# Patient Record
Sex: Male | Born: 1945
Health system: Southern US, Community
[De-identification: ages and names within clinical notes are randomized; demographics above are authoritative.]

## PROBLEM LIST (undated history)

## (undated) DIAGNOSIS — R9389 Abnormal findings on diagnostic imaging of other specified body structures: Secondary | ICD-10-CM

## (undated) DIAGNOSIS — M1990 Unspecified osteoarthritis, unspecified site: Secondary | ICD-10-CM

## (undated) DIAGNOSIS — E785 Hyperlipidemia, unspecified: Secondary | ICD-10-CM

## (undated) DIAGNOSIS — S21119A Laceration without foreign body of unspecified front wall of thorax without penetration into thoracic cavity, initial encounter: Secondary | ICD-10-CM

## (undated) DIAGNOSIS — I2510 Atherosclerotic heart disease of native coronary artery without angina pectoris: Secondary | ICD-10-CM

## (undated) DIAGNOSIS — I4891 Unspecified atrial fibrillation: Secondary | ICD-10-CM

## (undated) DIAGNOSIS — I493 Ventricular premature depolarization: Secondary | ICD-10-CM

## (undated) DIAGNOSIS — E291 Testicular hypofunction: Secondary | ICD-10-CM

## (undated) DIAGNOSIS — I10 Essential (primary) hypertension: Secondary | ICD-10-CM

## (undated) HISTORY — DX: Ventricular premature depolarization: I49.3

## (undated) HISTORY — DX: Abnormal findings on diagnostic imaging of other specified body structures: R93.89

## (undated) HISTORY — DX: Laceration without foreign body of unspecified front wall of thorax without penetration into thoracic cavity, initial encounter: S21.119A

## (undated) HISTORY — DX: Hyperlipidemia, unspecified: E78.5

## (undated) HISTORY — DX: Testicular hypofunction: E29.1

## (undated) HISTORY — PX: COLONOSCOPY: SHX174

## (undated) HISTORY — DX: Essential (primary) hypertension: I10

## (undated) HISTORY — DX: Unspecified atrial fibrillation: I48.91

## (undated) HISTORY — DX: Atherosclerotic heart disease of native coronary artery without angina pectoris: I25.10

## (undated) HISTORY — DX: Unspecified osteoarthritis, unspecified site: M19.90

## (undated) HISTORY — PX: FRACTURE SURGERY: SHX138

## (undated) HISTORY — PX: INGUINAL HERNIA REPAIR: SUR1180

---

## 2000-10-26 ENCOUNTER — Encounter: Payer: Self-pay | Admitting: Family Medicine

## 2000-10-26 ENCOUNTER — Encounter: Admission: RE | Admit: 2000-10-26 | Discharge: 2000-10-26 | Payer: Self-pay | Admitting: Family Medicine

## 2004-09-15 ENCOUNTER — Ambulatory Visit: Payer: Self-pay | Admitting: Gastroenterology

## 2004-09-26 ENCOUNTER — Ambulatory Visit: Payer: Self-pay | Admitting: Gastroenterology

## 2004-11-24 ENCOUNTER — Ambulatory Visit: Payer: Self-pay | Admitting: Cardiology

## 2004-12-01 ENCOUNTER — Ambulatory Visit: Payer: Self-pay

## 2005-04-14 ENCOUNTER — Encounter: Admission: RE | Admit: 2005-04-14 | Discharge: 2005-04-14 | Payer: Self-pay | Admitting: Sports Medicine

## 2005-04-27 ENCOUNTER — Encounter: Admission: RE | Admit: 2005-04-27 | Discharge: 2005-04-27 | Payer: Self-pay | Admitting: Sports Medicine

## 2006-05-17 ENCOUNTER — Encounter: Admission: RE | Admit: 2006-05-17 | Discharge: 2006-05-17 | Payer: Self-pay | Admitting: Family Medicine

## 2009-09-13 ENCOUNTER — Telehealth (INDEPENDENT_AMBULATORY_CARE_PROVIDER_SITE_OTHER): Payer: Self-pay | Admitting: *Deleted

## 2010-06-28 NOTE — Progress Notes (Signed)
  Faxed Colon over to [facility] [phone] [facility]  September 13, 2009 2:36 PM

## 2012-03-13 ENCOUNTER — Other Ambulatory Visit: Payer: Self-pay | Admitting: Internal Medicine

## 2012-03-13 ENCOUNTER — Ambulatory Visit
Admission: RE | Admit: 2012-03-13 | Discharge: 2012-03-13 | Disposition: A | Discharge status: Home / Self Care | Payer: BC Managed Care – PPO | Source: Ambulatory Visit | Attending: Internal Medicine | Admitting: Internal Medicine

## 2012-03-13 DIAGNOSIS — R51 Headache: Secondary | ICD-10-CM

## 2012-03-13 DIAGNOSIS — H538 Other visual disturbances: Secondary | ICD-10-CM

## 2012-03-13 DIAGNOSIS — Z139 Encounter for screening, unspecified: Secondary | ICD-10-CM

## 2013-04-23 ENCOUNTER — Ambulatory Visit (HOSPITAL_COMMUNITY)
Admission: RE | Admit: 2013-04-23 | Discharge: 2013-04-23 | Disposition: A | Discharge status: Home / Self Care | Payer: BC Managed Care – PPO | Source: Ambulatory Visit | Attending: Internal Medicine | Admitting: Internal Medicine

## 2013-04-23 ENCOUNTER — Other Ambulatory Visit (HOSPITAL_COMMUNITY): Payer: Self-pay | Admitting: Internal Medicine

## 2013-04-23 DIAGNOSIS — I6350 Cerebral infarction due to unspecified occlusion or stenosis of unspecified cerebral artery: Secondary | ICD-10-CM | POA: Insufficient documentation

## 2013-04-23 DIAGNOSIS — I639 Cerebral infarction, unspecified: Secondary | ICD-10-CM

## 2013-04-23 DIAGNOSIS — J329 Chronic sinusitis, unspecified: Secondary | ICD-10-CM | POA: Insufficient documentation

## 2013-04-23 DIAGNOSIS — G930 Cerebral cysts: Secondary | ICD-10-CM | POA: Insufficient documentation

## 2013-04-23 LAB — CREATININE, SERUM: GFR calc Af Amer: 82 mL/min — ABNORMAL LOW (ref >90)

## 2013-04-23 MED ORDER — GADOBENATE DIMEGLUMINE 529 MG/ML IV SOLN
18.0000 mL | Freq: Once | INTRAVENOUS | Status: AC | PRN
  Administered 2013-04-23: 18 mL via INTRAVENOUS

## 2014-11-24 ENCOUNTER — Telehealth: Payer: Self-pay | Admitting: Cardiology

## 2014-11-24 NOTE — Telephone Encounter (Signed)
°  Received records from [facility] for appointment on 02/02/15 with Dr Percival Spanish.  Records given to [facility] (medical records) for Dr Hochrein's schedule on 02/02/15. lp

## 2015-02-02 ENCOUNTER — Ambulatory Visit (INDEPENDENT_AMBULATORY_CARE_PROVIDER_SITE_OTHER): Payer: BLUE CROSS/BLUE SHIELD | Admitting: Cardiology

## 2015-02-02 ENCOUNTER — Encounter: Payer: Self-pay | Admitting: Cardiology

## 2015-02-02 VITALS — BP 178/78 | HR 79 | Ht 68.0 in | Wt 201.3 lb

## 2015-02-02 DIAGNOSIS — I10 Essential (primary) hypertension: Secondary | ICD-10-CM

## 2015-02-02 DIAGNOSIS — E785 Hyperlipidemia, unspecified: Secondary | ICD-10-CM | POA: Insufficient documentation

## 2015-02-02 DIAGNOSIS — R0602 Shortness of breath: Secondary | ICD-10-CM

## 2015-02-02 DIAGNOSIS — R0600 Dyspnea, unspecified: Secondary | ICD-10-CM | POA: Diagnosis not present

## 2015-02-02 HISTORY — DX: Essential (primary) hypertension: I10

## 2015-02-02 HISTORY — DX: Hyperlipidemia, unspecified: E78.5

## 2015-02-02 NOTE — Patient Instructions (Signed)
Your physician recommends that you schedule a follow-up appointment in: As Needed  Your physician has requested that you have an exercise tolerance test. For further information please visit [email or web address]. Please also follow instruction sheet, as given.  Your physician has requested that you have a CT Calcium Score

## 2015-02-02 NOTE — Progress Notes (Signed)
Cardiology Office Note   Date:  02/02/2015   ID:  Matthew Briggs, DOB 04/23/1946, MRN [id number]  PCP:  Matthew Redwood, MD  Cardiologist:   Matthew Breeding, MD   Chief Complaint  Patient presents with  . Hypertension  . Hyperlipidemia      History of Present Illness: Matthew Briggs is a 69 y.o. male who presents for evaluation of dyspnea. He has a history of hypertension hyperlipidemia and family history of early onset coronary disease. He's had treadmills in the past but has never been told that he had any abnormalities. He's had some shortness of breath doing activities such as climbing stairs. 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  He works ridiculously long hours and travels frequently. He goes up and down stairs dyspneic but is not describing any shortness of breath with rest, PND or orthopnea. He has had a history of PVCs but doesn't notice any palpitations, presyncope or syncope. He's had no chest pressure, neck or arm discomfort.    No past medical history on file.  Past Surgical History  Procedure Laterality Date  . Inguinal hernia repair       Current Outpatient Prescriptions  Medication Sig Dispense Refill  . ANDROGEL PUMP 20.25 MG/ACT (1.62%) GEL Apply 1 application topically daily.  0  . aspirin 81 MG tablet Take 81 mg by mouth daily.    . Calcium Carbonate-Vitamin D (CALCIUM 500 + D PO) Take 1 capsule by mouth daily.    . cholecalciferol (VITAMIN D) 1000 UNITS tablet Take 1,000 Units by mouth daily.    . Coenzyme Q10 (CO Q 10) 100 MG CAPS Take 1 capsule by mouth daily.    . CRESTOR 5 MG tablet Take 1 tablet by mouth daily.  1  . Multiple Vitamin (MULTIVITAMIN WITH MINERALS) TABS tablet Take 1 tablet by mouth daily.    . Omega 3 1000 MG CAPS Take 1 capsule by mouth daily.    Marland Kitchen POTASSIUM CHLORIDE PO Take 1 capsule by mouth daily.    . valsartan-hydrochlorothiazide (DIOVAN-HCT) 320-25 MG per tablet Take 1  tablet by mouth daily.  1   No current facility-administered medications for this visit.    Allergies:   Review of patient's allergies indicates no known allergies.    Social History:  The patient  reports that he has never smoked. He does not have any smokeless tobacco history on file. He reports that he drinks about 3.0 oz of alcohol per week. He reports that he does not use illicit drugs.   Family History:  The patient's family history includes CAD in his father; Heart attack (age of onset: 17) in his maternal grandfather; Heart attack (age of onset: 21) in his paternal grandfather.    ROS:  Please see the history of present illness.   Otherwise, review of systems are positive for none.   All other systems are reviewed and negative.    PHYSICAL EXAM: VS:  BP 178/78 mmHg  Pulse 79  Ht 5\' 8"  (1.727 m)  Wt 201 lb 4.8 oz (91.309 kg)  BMI 30.61 kg/m2 , BMI Body mass index is 30.61 kg/(m^2). GENERAL:  Well appearing HEENT:  Pupils equal round and reactive, fundi not visualized, oral mucosa unremarkable NECK:  No jugular venous distention, waveform within normal limits, carotid upstroke brisk and symmetric, no bruits, no thyromegaly LYMPHATICS:  No cervical, inguinal adenopathy LUNGS:  Clear to auscultation bilaterally  BACK:  No CVA tenderness CHEST:  Unremarkable HEART:  PMI not displaced or sustained,S1 and S2 within normal limits, no S3, no S4, no clicks, no rubs, no murmurs ABD:  Flat, positive bowel sounds normal in frequency in pitch, no bruits, no rebound, no guarding, no midline pulsatile mass, no hepatomegaly, no splenomegaly EXT:  2 plus pulses throughout, no edema, no cyanosis no clubbing SKIN:  No rashes no nodules NEURO:  Cranial nerves II through XII grossly intact, motor grossly intact throughout PSYCH:  Cognitively intact, oriented to person place and time    EKG:  EKG is ordered today. The ekg ordered today demonstrates sinus rhythm, rate 79, axis within normal  limits, poor anterior R wave progression, premature ventricular contractions. He   Recent Labs: No results found for requested labs within last 365 days.    Lipid Panel No results found for: CHOL, TRIG, HDL, CHOLHDL, VLDL, LDLCALC, LDLDIRECT    Wt Readings from Last 3 Encounters:  02/02/15 201 lb 4.8 oz (91.309 kg)      Other studies Reviewed: Additional studies/ records that were reviewed today include: Office records Review of the above records demonstrates:  Please see elsewhere in the note.     ASSESSMENT AND PLAN:  DYSPNEA:  He does have risk factors.   I will bring the patient back for a POET (Plain Old Exercise Test). This will allow me to screen for obstructive coronary disease, risk stratify and very importantly provide a prescription for exercise.  He will also have a coronary calcium score.  PVCs:  He has no symptoms related to this.  No change in therapy is indicated.   HTN:  He reports that the BP is usually well controlled.  At this point I will make no changes to the meds.  He will keep a BP diary.    STRESS:  We had a long discussion about this.     Current medicines are reviewed at length with the patient today.  The patient does not have concerns regarding medicines.  The following changes have been made:  no change  Labs/ tests ordered today include:   Orders Placed This Encounter  Procedures  . CT Cardiac Scoring  . Exercise Tolerance Test  . EKG 12-Lead     Disposition:   FU with me as needed.      Signed, Matthew Breeding, MD  02/02/2015 5:32 PM    [facility]

## 2015-02-25 ENCOUNTER — Telehealth (HOSPITAL_COMMUNITY): Payer: Self-pay

## 2015-02-25 NOTE — Telephone Encounter (Signed)
Encounter complete. 

## 2015-02-26 ENCOUNTER — Telehealth (HOSPITAL_COMMUNITY): Payer: Self-pay

## 2015-02-26 NOTE — Telephone Encounter (Signed)
Encounter complete. 

## 2015-03-02 ENCOUNTER — Ambulatory Visit (HOSPITAL_COMMUNITY)
Admission: RE | Admit: 2015-03-02 | Discharge: 2015-03-02 | Disposition: A | Discharge status: Home / Self Care | Payer: BLUE CROSS/BLUE SHIELD | Source: Ambulatory Visit | Attending: Cardiology | Admitting: Cardiology

## 2015-03-02 ENCOUNTER — Telehealth (HOSPITAL_COMMUNITY): Payer: Self-pay

## 2015-03-02 ENCOUNTER — Ambulatory Visit (INDEPENDENT_AMBULATORY_CARE_PROVIDER_SITE_OTHER)
Admission: RE | Admit: 2015-03-02 | Discharge: 2015-03-02 | Disposition: A | Discharge status: Home / Self Care | Payer: BLUE CROSS/BLUE SHIELD | Source: Ambulatory Visit | Attending: Cardiology | Admitting: Cardiology

## 2015-03-02 ENCOUNTER — Encounter (HOSPITAL_COMMUNITY): Payer: Self-pay | Admitting: *Deleted

## 2015-03-02 ENCOUNTER — Inpatient Hospital Stay (HOSPITAL_COMMUNITY): Admission: RE | Admit: 2015-03-02 | Payer: BLUE CROSS/BLUE SHIELD | Source: Ambulatory Visit

## 2015-03-02 DIAGNOSIS — R0602 Shortness of breath: Secondary | ICD-10-CM | POA: Insufficient documentation

## 2015-03-02 LAB — EXERCISE TOLERANCE TEST
CHL CUP RESTING HR STRESS: 64 {beats}/min
CHL RATE OF PERCEIVED EXERTION: 15
CSEPED: 9 min
CSEPEDS: 11 s
Estimated workload: 10.4 METS
MPHR: 153 {beats}/min
Peak HR: 133 {beats}/min
Percent HR: 86 %

## 2015-03-02 NOTE — Telephone Encounter (Signed)
Pt wife states that pt test was scheduled for 9:45 today. I explained to wife that I had previously misspoken the time of his appointment and I did speak with the patient in regards to the same. I also let wife know that the pt was left a message that there were appointment discrepancies and that I did notify scheduling in order to get all of the pt tests scheduled. I did ensure the pt wife that I was doing everything I could to ensure that the pt got all the tests and treatments that have been requested. Pt wife is obviously emotionally upset about the incident. I did assure her that Charlena Cross would be calling back to schedule the stress test and that the test would be scheduled as soon as possible. I did again call Charlena Cross and request that she give them a call ASAP.   Encounter complete.

## 2015-03-02 NOTE — Progress Notes (Signed)
Dr Oval Linsey reviewed ETT. Ok d/c pt home. Delight Hoh A

## 2015-03-02 NOTE — Telephone Encounter (Signed)
Pt wife is to confirm with pt that he has followed instructions. Pt wife notified that pt will have to reschedule if pt has not followed the instructions. Pt to arrive in 25 minutes. Wife is to return call if pt has not followed the instructions.

## 2015-03-08 ENCOUNTER — Encounter: Payer: Self-pay | Admitting: Gastroenterology

## 2015-03-22 ENCOUNTER — Telehealth: Payer: Self-pay | Admitting: Cardiology

## 2015-03-22 DIAGNOSIS — R931 Abnormal findings on diagnostic imaging of heart and coronary circulation: Secondary | ICD-10-CM

## 2015-03-22 DIAGNOSIS — R9439 Abnormal result of other cardiovascular function study: Secondary | ICD-10-CM

## 2015-03-22 NOTE — Telephone Encounter (Signed)
Returned call to patient regarding his POET results. Communicated info that based on elevated coronary calcium score the MD wanted to do a more sensitive stress test - nuclear stress test. Informed him someone will call to schedule. He voiced understanding.   Routed to MD - need to know if wants exercise nuc or lexiscan?

## 2015-03-22 NOTE — Telephone Encounter (Signed)
Pt would like his stress test results asap please.

## 2015-03-23 ENCOUNTER — Encounter: Payer: Self-pay | Admitting: Cardiology

## 2015-03-24 NOTE — Telephone Encounter (Signed)
Returning your call from yesterday. °

## 2015-03-24 NOTE — Telephone Encounter (Signed)
Spoke with MD - lexiscan myoview ordered. Staff message sent to scheduler to arrange.

## 2015-03-26 ENCOUNTER — Telehealth (HOSPITAL_COMMUNITY): Payer: Self-pay

## 2015-03-26 NOTE — Telephone Encounter (Signed)
Encounter complete. 

## 2015-03-30 ENCOUNTER — Ambulatory Visit (HOSPITAL_COMMUNITY)
Admission: RE | Admit: 2015-03-30 | Discharge: 2015-03-30 | Disposition: A | Discharge status: Home / Self Care | Payer: BLUE CROSS/BLUE SHIELD | Source: Ambulatory Visit | Attending: Internal Medicine | Admitting: Internal Medicine

## 2015-03-30 DIAGNOSIS — Z8249 Family history of ischemic heart disease and other diseases of the circulatory system: Secondary | ICD-10-CM | POA: Insufficient documentation

## 2015-03-30 DIAGNOSIS — Z6830 Body mass index (BMI) 30.0-30.9, adult: Secondary | ICD-10-CM | POA: Diagnosis not present

## 2015-03-30 DIAGNOSIS — R42 Dizziness and giddiness: Secondary | ICD-10-CM | POA: Insufficient documentation

## 2015-03-30 DIAGNOSIS — E663 Overweight: Secondary | ICD-10-CM | POA: Insufficient documentation

## 2015-03-30 DIAGNOSIS — R0609 Other forms of dyspnea: Secondary | ICD-10-CM | POA: Insufficient documentation

## 2015-03-30 DIAGNOSIS — R9439 Abnormal result of other cardiovascular function study: Secondary | ICD-10-CM

## 2015-03-30 DIAGNOSIS — I2510 Atherosclerotic heart disease of native coronary artery without angina pectoris: Secondary | ICD-10-CM | POA: Insufficient documentation

## 2015-03-30 DIAGNOSIS — I10 Essential (primary) hypertension: Secondary | ICD-10-CM | POA: Insufficient documentation

## 2015-03-30 DIAGNOSIS — R002 Palpitations: Secondary | ICD-10-CM | POA: Insufficient documentation

## 2015-03-30 DIAGNOSIS — R931 Abnormal findings on diagnostic imaging of heart and coronary circulation: Secondary | ICD-10-CM

## 2015-03-30 LAB — MYOCARDIAL PERFUSION IMAGING
Peak HR: 80 {beats}/min
Rest HR: 54 {beats}/min
SDS: 5
SRS: 5
SSS: 8
TID: 1.21

## 2015-03-30 MED ORDER — REGADENOSON 0.4 MG/5ML IV SOLN
0.4000 mg | Freq: Once | INTRAVENOUS | Status: AC
  Administered 2015-03-30: 0.4 mg via INTRAVENOUS

## 2015-03-30 MED ORDER — TECHNETIUM TC 99M SESTAMIBI GENERIC - CARDIOLITE
30.9000 | Freq: Once | INTRAVENOUS | Status: AC | PRN
  Administered 2015-03-30: 30.9 via INTRAVENOUS

## 2015-03-30 MED ORDER — TECHNETIUM TC 99M SESTAMIBI GENERIC - CARDIOLITE
10.9000 | Freq: Once | INTRAVENOUS | Status: AC | PRN
  Administered 2015-03-30: 10.9 via INTRAVENOUS

## 2015-03-30 MED ORDER — AMINOPHYLLINE 25 MG/ML IV SOLN
75.0000 mg | Freq: Once | INTRAVENOUS | Status: AC
  Administered 2015-03-30: 75 mg via INTRAVENOUS

## 2015-04-29 ENCOUNTER — Ambulatory Visit: Payer: BLUE CROSS/BLUE SHIELD | Admitting: Cardiology

## 2015-05-10 ENCOUNTER — Encounter: Payer: Self-pay | Admitting: Cardiology

## 2015-05-10 ENCOUNTER — Ambulatory Visit (INDEPENDENT_AMBULATORY_CARE_PROVIDER_SITE_OTHER): Payer: BLUE CROSS/BLUE SHIELD | Admitting: Cardiology

## 2015-05-10 VITALS — BP 158/100 | HR 78 | Ht 68.0 in | Wt 200.0 lb

## 2015-05-10 DIAGNOSIS — I2510 Atherosclerotic heart disease of native coronary artery without angina pectoris: Secondary | ICD-10-CM

## 2015-05-10 DIAGNOSIS — R931 Abnormal findings on diagnostic imaging of heart and coronary circulation: Secondary | ICD-10-CM | POA: Insufficient documentation

## 2015-05-10 MED ORDER — AMLODIPINE BESYLATE 2.5 MG PO TABS: 2.5000 mg | ORAL_TABLET | Freq: Every day | ORAL | Status: DC

## 2015-05-10 NOTE — Progress Notes (Signed)
Cardiology Office Note   Date:  05/10/2015   ID:  Matthew Briggs, DOB 07-Jan-1946, MRN [id number]  PCP:  Marton Redwood, MD  Cardiologist:   Minus Breeding, MD   Chief Complaint  Patient presents with  . Coronary Caclcium      History of Present Illness: Matthew Briggs is a 69 y.o. male who presents for evaluation of dyspnea. He has a history of hypertension hyperlipidemia and family history of early onset coronary disease. I sent him for a calcium score that was greater than 800 and 89% for his age.  He had a stress perfusion study that demonstrated  A fixed defect in the apical region.  The study was not gated secondary to PVCs.   There was a small pulmonary nodule noted as well.      He's had treadmills in the past but has never been told that he had any abnormalities. He's had some shortness of breath doing activities such as climbing stairs. 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  He works ridiculously long hours and travels frequently. He goes up and down stairs dyspneic but is not describing any shortness of breath with rest, PND or orthopnea. He has had a history of PVCs but doesn't notice any palpitations, presyncope or syncope. He's had no chest pressure, neck or arm discomfort.    Past Medical History  Diagnosis Date  . HTN (hypertension)   . Hyperlipemia   . Hypogonadism, male   . Abnormal MRI     Past Surgical History  Procedure Laterality Date  . Inguinal hernia repair       Current Outpatient Prescriptions  Medication Sig Dispense Refill  . ANDROGEL PUMP 20.25 MG/ACT (1.62%) GEL Apply 1 application topically daily.  0  . aspirin 81 MG tablet Take 81 mg by mouth daily.    . Calcium Carbonate-Vitamin D (CALCIUM 500 + D PO) Take 1 capsule by mouth daily.    . cholecalciferol (VITAMIN D) 1000 UNITS tablet Take 1,000 Units by mouth daily.    . Coenzyme Q10 (CO Q 10) 100 MG CAPS Take 1 capsule by mouth  daily.    . CRESTOR 5 MG tablet Take 1 tablet by mouth daily.  1  . Multiple Vitamin (MULTIVITAMIN WITH MINERALS) TABS tablet Take 1 tablet by mouth daily.    . Omega 3 1000 MG CAPS Take 1 capsule by mouth daily.    Marland Kitchen POTASSIUM CHLORIDE PO Take 1 capsule by mouth daily.    . valsartan-hydrochlorothiazide (DIOVAN-HCT) 320-25 MG per tablet Take 1 tablet by mouth daily.  1  . amLODipine (NORVASC) 2.5 MG tablet Take 1 tablet (2.5 mg total) by mouth daily. 30 tablet 6   No current facility-administered medications for this visit.    Allergies:   Review of patient's allergies indicates no known allergies.    ROS:  Please see the history of present illness.   Otherwise, review of systems are positive for none.   All other systems are reviewed and negative.    PHYSICAL EXAM: VS:  BP 158/100 mmHg  Pulse 78  Ht 5\' 8"  (1.727 m)  Wt 200 lb (90.719 kg)  BMI 30.42 kg/m2 , BMI Body mass index is 30.42 kg/(m^2). GENERAL:  Well appearing HEENT:  Pupils equal round and reactive, fundi not visualized, oral mucosa unremarkable NECK:  No jugular venous distention, waveform within normal limits, carotid upstroke brisk and symmetric, no  bruits, no thyromegaly LYMPHATICS:  No cervical, inguinal adenopathy LUNGS:  Clear to auscultation bilaterally BACK:  No CVA tenderness CHEST:  Unremarkable HEART:  PMI not displaced or sustained,S1 and S2 within normal limits, no S3, no S4, no clicks, no rubs, no murmurs ABD:  Flat, positive bowel sounds normal in frequency in pitch, no bruits, no rebound, no guarding, no midline pulsatile mass, no hepatomegaly, no splenomegaly EXT:  2 plus pulses throughout, no edema, no cyanosis no clubbing SKIN:  No rashes no nodules NEURO:  Cranial nerves II through XII grossly intact, motor grossly intact throughout PSYCH:  Cognitively intact, oriented to person place and time    EKG:  EKG is ordered today. The ekg ordered today demonstrates sinus rhythm, rate 79, axis within  normal limits, poor anterior R wave progression, premature ventricular contractions. He   Recent Labs: No results found for requested labs within last 365 days.    Lipid Panel No results found for: CHOL, TRIG, HDL, CHOLHDL, VLDL, LDLCALC, LDLDIRECT    Wt Readings from Last 3 Encounters:  05/10/15 200 lb (90.719 kg)  03/30/15 201 lb (91.173 kg)  02/02/15 201 lb 4.8 oz (91.309 kg)      Other studies Reviewed: Additional studies/ records that were reviewed today include: Office records Review of the above records demonstrates:  Please see elsewhere in the note.     ASSESSMENT AND PLAN:  CORONARY CALCIUM:  He needs aggressive risk reduction and we talked about this. I will suggest a POET (Plain Old Exercise Treadmill) yearly.    PULMONARY NODULE:  He is not high risk for lung cancer so no follow-up is suggested.  PVCs:  He has no symptoms related to this.  No change in therapy is indicated.   HTN:  I suspect his blood pressure is fine most of the time because of stress. I'm going to add Norvasc 2.5 mg daily.  DYSLIPIDEMIA:  He reports that his LDL was somewhat high but his HDL was good. I will defer to Marton Redwood, MD with a suggestion that his goal LDL should be in the 26s.   STRESS:  We had a long discussion about this at the last appt.     Current medicines are reviewed at length with the patient today.  The patient does not have concerns regarding medicines.  The following changes have been made:  As above  Labs/ tests ordered today include:    Orders Placed This Encounter  Procedures  . Exercise Tolerance Test     Disposition:   FU with me in one year after POET (Plain Old Exercise Treadmill)   Signed, Minus Breeding, MD  05/10/2015 11:19 AM    [facility]

## 2015-05-10 NOTE — Patient Instructions (Signed)
Your physician wants you to follow-up in: 1 Year. You will receive a reminder letter in the mail two months in advance. If you don't receive a letter, please call our office to schedule the follow-up appointment.  Your physician has requested that you have an exercise tolerance test in 1 Year. For further information please visit [email or web address]. Please also follow instruction sheet, as given.  Your physician has recommended you make the following change in your medication: START Norvasc 2.5 mg daily  Merry Christmas and [place]!!

## 2015-05-11 ENCOUNTER — Encounter: Payer: BLUE CROSS/BLUE SHIELD | Admitting: Gastroenterology

## 2015-05-25 ENCOUNTER — Telehealth: Payer: Self-pay | Admitting: *Deleted

## 2015-05-25 NOTE — Telephone Encounter (Signed)
Since POET is ordered we should wait for this result before colonoscopy.

## 2015-05-25 NOTE — Telephone Encounter (Signed)
Dr Fuller Plan, Pt is scheduled with you for a recall colon for screening 1-19 Thursday.  He has recently seen cardio for an abnormal stress test . His calcium score was > than 800 and 89 % for his age and he was instructed he needs aggressive treatment for this.  He has had past treadmills that were all normal and he had some PVC'S that he is not symptomatic for . Cardio ordered POET and started Norvasc 2.5 mg daily. Pt has high stress due to job.  There are  no results  for this exercise tolerance test .  Do we need to get these results  prior to his colon?  Please advise Thanks, Marijean Niemann

## 2015-05-25 NOTE — Telephone Encounter (Signed)
Dr Fuller Plan, pt states he had an exercise test 03-30-2015 that was normal and the order is for next year for the POET.   Im very sorry, I failed to see the yearly in the cardio note.  So with this being normal, he should be good to go as scheduled??  Thanks and again sorry, Lelan Pons PV

## 2015-05-26 NOTE — Telephone Encounter (Signed)
Yes, ok to schedule colonoscopy now

## 2015-05-27 NOTE — Telephone Encounter (Signed)
Thank you. Will proceed as scheduled

## 2015-06-03 ENCOUNTER — Ambulatory Visit (AMBULATORY_SURGERY_CENTER): Payer: Self-pay | Admitting: *Deleted

## 2015-06-03 VITALS — Ht 69.0 in | Wt 198.6 lb

## 2015-06-03 DIAGNOSIS — Z1211 Encounter for screening for malignant neoplasm of colon: Secondary | ICD-10-CM

## 2015-06-03 MED ORDER — NA SULFATE-K SULFATE-MG SULF 17.5-3.13-1.6 GM/177ML PO SOLN: ORAL | Status: DC

## 2015-06-03 NOTE — Progress Notes (Signed)
No egg or soy allergy  No trouble with intubation  No diet medications taken

## 2015-06-15 ENCOUNTER — Encounter: Payer: Self-pay | Admitting: Gastroenterology

## 2015-06-15 ENCOUNTER — Ambulatory Visit (AMBULATORY_SURGERY_CENTER): Payer: BLUE CROSS/BLUE SHIELD | Admitting: Gastroenterology

## 2015-06-15 VITALS — BP 118/73 | HR 53 | Temp 96.7°F | Resp 20 | Ht 69.0 in | Wt 198.0 lb

## 2015-06-15 DIAGNOSIS — D123 Benign neoplasm of transverse colon: Secondary | ICD-10-CM | POA: Diagnosis not present

## 2015-06-15 DIAGNOSIS — D122 Benign neoplasm of ascending colon: Secondary | ICD-10-CM | POA: Diagnosis not present

## 2015-06-15 DIAGNOSIS — Z1211 Encounter for screening for malignant neoplasm of colon: Secondary | ICD-10-CM | POA: Diagnosis not present

## 2015-06-15 MED ORDER — SODIUM CHLORIDE 0.9 % IV SOLN: 500.0000 mL | INTRAVENOUS | Status: DC

## 2015-06-15 NOTE — Progress Notes (Signed)
Report to PACU, RN, vss, BBS= Clear.  

## 2015-06-15 NOTE — Op Note (Signed)
[place]  [address]. [place], [id number]   COLONOSCOPY PROCEDURE REPORT  PATIENT: Matthew, Briggs  MR#: [id number] BIRTHDATE: 09-15-1945 , 30  yrs. old GENDER: male ENDOSCOPIST: Ladene Artist, MD, [facility] REFERRED BY:W.  Lutricia Feil, M.D. PROCEDURE DATE:  06/15/2015 PROCEDURE:   Colonoscopy, screening and Colonoscopy with snare polypectomy First Screening Colonoscopy - Avg.  risk and is 50 yrs.  old or older - No.  Prior Negative Screening - Now for repeat screening. 10 or more years since last screening  History of Adenoma - Now for follow-up colonoscopy & has been > or = to 3 yrs.  N/A  Polyps removed today? Yes ASA CLASS:   Class II INDICATIONS:Screening for colonic neoplasia and Colorectal Neoplasm Risk Assessment for this procedure is average risk. MEDICATIONS: Monitored anesthesia care and Propofol 200 mg IV DESCRIPTION OF PROCEDURE:   After the risks benefits and alternatives of the procedure were thoroughly explained, informed consent was obtained.  The digital rectal exam revealed no abnormalities of the rectum.   The [id number]  endoscope was introduced through the anus and advanced to the cecum, which was identified by both the appendix and ileocecal valve. No adverse events experienced.   The quality of the prep was excellent. (Suprep was used)  The instrument was then slowly withdrawn as the colon was fully examined. Estimated blood loss is zero unless otherwise noted in this procedure report.    COLON FINDINGS: Two sessile polyps measuring 6 mm in size were found in the transverse colon and ascending colon.  Polypectomies were performed with a cold snare.  The resection was complete, the polyp tissue was completely retrieved and sent to histology.   There was moderate diverticulosis noted in the sigmoid colon and descending colon with associated luminal narrowing and tortuosity.   The examination was otherwise normal.  Retroflexed  views revealed no abnormalities. The time to cecum = 2.5 Withdrawal time = 11.3   The scope was withdrawn and the procedure completed. COMPLICATIONS: There were no immediate complications.  ENDOSCOPIC IMPRESSION: 1.   Two sessile polyps in the transverse colon and ascending colon; polypectomies performed with a cold snare 2.   Moderate diverticulosis in the sigmoid colon and descending colon  RECOMMENDATIONS: 1.  Await pathology results 2.  High fiber diet with liberal fluid intake. 3.  Repeat colonoscopy in 5 years if polyp(s) adenomatous; otherwise 10 years  eSigned:  Ladene Artist, MD, [facility] 06/15/2015 11:41 AM

## 2015-06-15 NOTE — Patient Instructions (Signed)
YOU HAD AN ENDOSCOPIC PROCEDURE TODAY AT THE [facility]:   Refer to the procedure report that was given to you for any specific questions about what was found during the examination.  If the procedure report does not answer your questions, please call your gastroenterologist to clarify.  If you requested that your care partner not be given the details of your procedure findings, then the procedure report has been included in a sealed envelope for you to review at your convenience later.  YOU SHOULD EXPECT: Some feelings of bloating in the abdomen. Passage of more gas than usual.  Walking can help get rid of the air that was put into your GI tract during the procedure and reduce the bloating. If you had a lower endoscopy (such as a colonoscopy or flexible sigmoidoscopy) you may notice spotting of blood in your stool or on the toilet paper. If you underwent a bowel prep for your procedure, you may not have a normal bowel movement for a few days.  Please Note:  You might notice some irritation and congestion in your nose or some drainage.  This is from the oxygen used during your procedure.  There is no need for concern and it should clear up in a day or so.  SYMPTOMS TO REPORT IMMEDIATELY:   Following lower endoscopy (colonoscopy or flexible sigmoidoscopy):  Excessive amounts of blood in the stool  Significant tenderness or worsening of abdominal pains  Swelling of the abdomen that is new, acute  Fever of 100F or higher    For urgent or emergent issues, a gastroenterologist can be reached at any hour by calling [phone].   DIET: Your first meal following the procedure should be a small meal and then it is ok to progress to your normal diet. Heavy or fried foods are harder to digest and may make you feel nauseous or bloated.  Likewise, meals heavy in dairy and vegetables can increase bloating.  Drink plenty of fluids but you should avoid alcoholic beverages for 24  hours.  ACTIVITY:  You should plan to take it easy for the rest of today and you should NOT DRIVE or use heavy machinery until tomorrow (because of the sedation medicines used during the test).    FOLLOW UP: Our staff will call the number listed on your records the next business day following your procedure to check on you and address any questions or concerns that you may have regarding the information given to you following your procedure. If we do not reach you, we will leave a message.  However, if you are feeling well and you are not experiencing any problems, there is no need to return our call.  We will assume that you have returned to your regular daily activities without incident.  If any biopsies were taken you will be contacted by phone or by letter within the next 1-3 weeks.  Please call us at [phone] if you have not heard about the biopsies in 3 weeks.    SIGNATURES/CONFIDENTIALITY: You and/or your care partner have signed paperwork which will be entered into your electronic medical record.  These signatures attest to the fact that that the information above on your After Visit Summary has been reviewed and is understood.  Full responsibility of the confidentiality of this discharge information lies with you and/or your care-partner.   Resume medications. Information given on polyps,diverticulosis and high fiber diet. 

## 2015-06-15 NOTE — Progress Notes (Signed)
Called to room to assist during endoscopic procedure.  Patient ID and intended procedure confirmed with present staff. Received instructions for my participation in the procedure from the performing physician.  

## 2015-06-16 ENCOUNTER — Telehealth: Payer: Self-pay

## 2015-06-16 NOTE — Telephone Encounter (Signed)
  Follow up Call-  Call back number 06/15/2015  Post procedure Call Back phone  # [phone]  Permission to leave phone message Yes     Patient questions:  Do you have a fever, pain , or abdominal swelling? No. Pain Score  0 *  Have you tolerated food without any problems? Yes.    Have you been able to return to your normal activities? Yes.    Do you have any questions about your discharge instructions: Diet   No. Medications  No. Follow up visit  No.  Do you have questions or concerns about your Care? No.  Actions: * If pain score is 4 or above: No action needed, pain <4.

## 2015-06-17 ENCOUNTER — Encounter: Payer: BLUE CROSS/BLUE SHIELD | Admitting: Gastroenterology

## 2015-06-24 ENCOUNTER — Encounter: Payer: Self-pay | Admitting: Gastroenterology

## 2015-12-22 ENCOUNTER — Other Ambulatory Visit: Payer: Self-pay

## 2015-12-22 MED ORDER — AMLODIPINE BESYLATE 2.5 MG PO TABS: 2.5000 mg | ORAL_TABLET | Freq: Every day | ORAL | 6 refills | Status: DC

## 2016-05-17 NOTE — Progress Notes (Signed)
Cardiology Office Note   Date:  05/19/2016   ID:  Matthew Briggs, DOB 1945-08-14, MRN [id number]  PCP:  Matthew Redwood, MD  Cardiologist:   Matthew Breeding, MD   Chief Complaint  Patient presents with  . Coronary Calcium     History of Present Illness: Matthew Briggs is a 70 y.o. male who presents for evaluation follow up of calcium score that was greater than 800 and 89% for his age.  He had a stress perfusion study that demonstrated a fixed defect in the apical region.  The study was not gated secondary to PVCs.   There was a small pulmonary nodule noted as well.  He returns for follow up.  He continues to have a high stress job. 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  With this level of activity he denies any cardiovascular symptoms however. He might have some shortness of breath with significant exertion.  The patient denies any new symptoms such as chest discomfort, neck or arm discomfort. There has been no new shortness of breath, PND or orthopnea. There have been no reported palpitations, presyncope or syncope.  He has not noticed his.   Past Medical History:  Diagnosis Date  . Abnormal MRI   . Arthritis   . HTN (hypertension)   . Hyperlipemia   . Hypogonadism, male     Past Surgical History:  Procedure Laterality Date  . COLONOSCOPY    . INGUINAL HERNIA REPAIR       Current Outpatient Prescriptions  Medication Sig Dispense Refill  . amLODipine (NORVASC) 2.5 MG tablet Take 1 tablet (2.5 mg total) by mouth daily. 30 tablet 6  . aspirin 81 MG tablet Take 81 mg by mouth daily.    . Calcium Carbonate-Vitamin D (CALCIUM 500 + D PO) Take 1 capsule by mouth daily.    . cholecalciferol (VITAMIN D) 1000 UNITS tablet Take 1,000 Units by mouth daily.    . Coenzyme Q10 (CO Q 10) 100 MG CAPS Take 1 capsule by mouth daily.    . CRESTOR 5 MG tablet Take 1 tablet by mouth daily.  1  . Multiple Vitamin (MULTIVITAMIN WITH MINERALS) TABS tablet Take 1 tablet by mouth daily.    . NON  FORMULARY     . Omega 3 1000 MG CAPS Take 1 capsule by mouth daily.    Marland Kitchen POTASSIUM CHLORIDE PO Take 1 capsule by mouth daily.    . valsartan-hydrochlorothiazide (DIOVAN-HCT) 320-25 MG per tablet Take 1 tablet by mouth daily.  1   No current facility-administered medications for this visit.     Allergies:   Patient has no known allergies.    ROS:  Please see the history of present illness.   Otherwise, review of systems are positive for cough.   All other systems are reviewed and negative.    PHYSICAL EXAM: VS:  BP 126/72   Pulse 61   Ht 5\' 8"  (1.727 m)   Wt 204 lb (92.5 kg)   BMI 31.02 kg/m  , BMI Body mass index is 31.02 kg/m. GENERAL:  Well appearing NECK:  No jugular venous distention, waveform within normal limits, carotid upstroke brisk and symmetric, no bruits, no thyromegaly LUNGS:  Clear to auscultation bilaterally BACK:  No CVA tenderness CHEST:  Unremarkable HEART:  PMI not displaced or sustained,S1 and S2 within normal limits, no S3, no S4, no clicks, no rubs, no murmurs ABD:  Flat, positive bowel sounds normal in frequency in pitch, no bruits,  no rebound, no guarding, no midline pulsatile mass, no hepatomegaly, no splenomegaly EXT:  2 plus pulses throughout, no edema, no cyanosis no clubbing SKIN:  No rashes no nodules   EKG:  EKG is  ordered today. The ekg ordered today demonstrates sinus rhythm, rate 61, axis within normal limits, poor anterior R wave progression, premature ventricular contractions. He   Recent Labs: No results found for requested labs within last 8760 hours.    Lipid Panel No results found for: CHOL, TRIG, HDL, CHOLHDL, VLDL, LDLCALC, LDLDIRECT    Wt Readings from Last 3 Encounters:  05/19/16 204 lb (92.5 kg)  06/15/15 198 lb (89.8 kg)  06/03/15 198 lb 9.6 oz (90.1 kg)      Other studies Reviewed: Additional studies/ records that were reviewed today include: Office records Review of the above records demonstrates:  Please see  elsewhere in the note.     ASSESSMENT AND PLAN:  CORONARY CALCIUM:  He needs aggressive risk reduction and we talked about this. I will suggest a POET (Plain Old Exercise Treadmill) yearly.  He will come back to have this done.    PULMONARY NODULE:  He is not high risk for lung cancer so no follow-up is suggested.    PVCs:  He has no symptoms related to this.  No change in therapy is indicated.   HTN:  At the last appt I added Norvasc 2.5 mg.   His BP seems to be at target.  DYSLIPIDEMIA:  He reports that his LDL was somewhat high but his HDL was good. I will defer to Matthew Redwood, MD with a suggestion that his goal LDL should be in the 1s.   STRESS:  He is thinking about retiring.     Current medicines are reviewed at length with the patient today.  The patient does not have concerns regarding medicines.  The following changes have been made:  None  Labs/ tests ordered today include:    Orders Placed This Encounter  Procedures  . EKG 12-Lead     Disposition:   FU with me in one year.    Signed, Matthew Breeding, MD  05/19/2016 9:30 AM    [place]

## 2016-05-19 ENCOUNTER — Encounter: Payer: Self-pay | Admitting: Cardiology

## 2016-05-19 ENCOUNTER — Ambulatory Visit (INDEPENDENT_AMBULATORY_CARE_PROVIDER_SITE_OTHER): Payer: BLUE CROSS/BLUE SHIELD | Admitting: Cardiology

## 2016-05-19 VITALS — BP 126/72 | HR 61 | Ht 68.0 in | Wt 204.0 lb

## 2016-05-19 DIAGNOSIS — R0609 Other forms of dyspnea: Secondary | ICD-10-CM | POA: Diagnosis not present

## 2016-05-19 DIAGNOSIS — R931 Abnormal findings on diagnostic imaging of heart and coronary circulation: Secondary | ICD-10-CM

## 2016-05-19 DIAGNOSIS — E782 Mixed hyperlipidemia: Secondary | ICD-10-CM | POA: Diagnosis not present

## 2016-05-19 NOTE — Patient Instructions (Signed)
Medication Instructions:  Continue current medications  Labwork: None Ordered  Testing/Procedures: Your physician has requested that you have an exercise tolerance test. For further information please visit [email or web address]. Please also follow instruction sheet, as given.  Follow-Up: Your physician wants you to follow-up in: 1 Year. You will receive a reminder letter in the mail two months in advance. If you don't receive a letter, please call our office to schedule the follow-up appointment.   Any Other Special Instructions Will Be Listed Below (If Applicable).           HAPPY HOLIDAY  If you need a refill on your cardiac medications before your next appointment, please call your pharmacy.

## 2016-06-02 ENCOUNTER — Telehealth (HOSPITAL_COMMUNITY): Payer: Self-pay

## 2016-06-02 NOTE — Telephone Encounter (Signed)
Encounter complete. 

## 2016-06-07 ENCOUNTER — Ambulatory Visit (HOSPITAL_COMMUNITY)
Admission: RE | Admit: 2016-06-07 | Discharge: 2016-06-07 | Disposition: A | Discharge status: Home / Self Care | Payer: PPO | Source: Ambulatory Visit | Attending: Cardiovascular Disease | Admitting: Cardiovascular Disease

## 2016-06-07 DIAGNOSIS — R931 Abnormal findings on diagnostic imaging of heart and coronary circulation: Secondary | ICD-10-CM | POA: Diagnosis not present

## 2016-06-07 LAB — EXERCISE TOLERANCE TEST
CHL CUP MPHR: 152 {beats}/min
CSEPED: 8 min
CSEPEDS: 0 s
CSEPHR: 90 %
Estimated workload: 10.1 METS
Peak HR: 137 {beats}/min
RPE: 17
Rest HR: 61 {beats}/min

## 2016-08-01 DIAGNOSIS — M67912 Unspecified disorder of synovium and tendon, left shoulder: Secondary | ICD-10-CM | POA: Diagnosis not present

## 2016-08-01 DIAGNOSIS — M79672 Pain in left foot: Secondary | ICD-10-CM | POA: Diagnosis not present

## 2016-08-05 ENCOUNTER — Other Ambulatory Visit: Payer: Self-pay | Admitting: Cardiology

## 2016-12-15 DIAGNOSIS — D225 Melanocytic nevi of trunk: Secondary | ICD-10-CM | POA: Diagnosis not present

## 2016-12-15 DIAGNOSIS — D1801 Hemangioma of skin and subcutaneous tissue: Secondary | ICD-10-CM | POA: Diagnosis not present

## 2016-12-15 DIAGNOSIS — L570 Actinic keratosis: Secondary | ICD-10-CM | POA: Diagnosis not present

## 2016-12-15 DIAGNOSIS — L821 Other seborrheic keratosis: Secondary | ICD-10-CM | POA: Diagnosis not present

## 2016-12-15 DIAGNOSIS — Z85828 Personal history of other malignant neoplasm of skin: Secondary | ICD-10-CM | POA: Diagnosis not present

## 2017-01-02 DIAGNOSIS — H2513 Age-related nuclear cataract, bilateral: Secondary | ICD-10-CM | POA: Diagnosis not present

## 2017-01-02 DIAGNOSIS — H524 Presbyopia: Secondary | ICD-10-CM | POA: Diagnosis not present

## 2017-01-24 ENCOUNTER — Telehealth: Payer: Self-pay | Admitting: Cardiology

## 2017-01-24 DIAGNOSIS — E784 Other hyperlipidemia: Secondary | ICD-10-CM | POA: Diagnosis not present

## 2017-01-24 DIAGNOSIS — I2510 Atherosclerotic heart disease of native coronary artery without angina pectoris: Secondary | ICD-10-CM | POA: Diagnosis not present

## 2017-01-24 DIAGNOSIS — R002 Palpitations: Secondary | ICD-10-CM | POA: Diagnosis not present

## 2017-01-24 DIAGNOSIS — I10 Essential (primary) hypertension: Secondary | ICD-10-CM | POA: Diagnosis not present

## 2017-01-24 DIAGNOSIS — Z6830 Body mass index (BMI) 30.0-30.9, adult: Secondary | ICD-10-CM | POA: Diagnosis not present

## 2017-01-24 DIAGNOSIS — R0609 Other forms of dyspnea: Secondary | ICD-10-CM | POA: Diagnosis not present

## 2017-01-24 NOTE — Telephone Encounter (Signed)
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

## 2017-01-24 NOTE — Telephone Encounter (Signed)
New message   Pt wife calling.  Patient c/o Palpitations:  High priority if patient c/o lightheadedness and shortness of breath.  1. How long have you been having palpitations? Over 2-3 days  2. Are you currently experiencing lightheadedness and shortness of breath? Not mentioned to wife.  3. Have you checked your BP and heart rate? (document readings) no   4. Are you experiencing any other symptoms? Not that he told his wife.

## 2017-01-25 NOTE — Progress Notes (Signed)
Cardiology Office Note   Date:  01/27/2017   ID:  Matthew Briggs, DOB [birth year], MRN [id number]  PCP:  Marton Redwood, MD  Cardiologist:   Minus Breeding, MD   Chief Complaint  Patient presents with  . Palpitations     History of Present Illness: Matthew Briggs is a 71 y.o. male who presents for evaluation follow up of calcium score that was greater than 800 and 89% for his age.  He had a stress perfusion study in 2016 that demonstrated a fixed defect in the apical region.  The study was not gated secondary to PVCs. He had a POET (Plain Old Exercise Treadmill) in Jan that demonstrated no ischemia.   He recently called with palpitations.  He said that this happened twice. Once he was walking rapidly upstairs. Another time he was moving boxes and it was hot where he was. He said his heart rate went fast. He had to stop and let it slowed down. He thinks it took several minutes with slow down. He didn't feel comfortable with this but he didn't really describe chest pressure, neck or arm discomfort. He didn't have any presyncope or syncope. No shortness of breath, PND or orthopnea. He has no weight gain or edema.  He has otherwise felt well.  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    Past Medical History:  Diagnosis Date  . Abnormal MRI   . Arthritis   . HTN (hypertension)   . Hyperlipemia   . Hypogonadism, male     Past Surgical History:  Procedure Laterality Date  . COLONOSCOPY    . INGUINAL HERNIA REPAIR       Current Outpatient Prescriptions  Medication Sig Dispense Refill  . amLODipine (NORVASC) 2.5 MG tablet take 1 tablet by mouth once daily 30 tablet 10  . aspirin 81 MG tablet Take 81 mg by mouth daily.    . Calcium Carbonate-Vitamin D (CALCIUM 500 + D PO) Take 1 capsule by mouth daily.    . cholecalciferol (VITAMIN D) 1000 UNITS tablet Take 1,000 Units by mouth daily.    . Coenzyme Q10 (CO Q 10) 100 MG CAPS Take 1 capsule by mouth daily.    .  CRESTOR 5 MG tablet Take 1 tablet by mouth daily.  1  . Multiple Vitamin (MULTIVITAMIN WITH MINERALS) TABS tablet Take 1 tablet by mouth daily.    . NON FORMULARY     . Omega 3 1000 MG CAPS Take 1 capsule by mouth daily.    Marland Kitchen POTASSIUM CHLORIDE PO Take 1 capsule by mouth daily.    . valsartan-hydrochlorothiazide (DIOVAN-HCT) 320-25 MG per tablet Take 1 tablet by mouth daily.  1   No current facility-administered medications for this visit.     Allergies:   Patient has no known allergies.    ROS:  Please see the history of present illness.   Otherwise, review of systems are positive for none.   All other systems are reviewed and negative.    PHYSICAL EXAM: VS:  BP (!) 148/75   Pulse 63   Ht 5\' 8"  (1.727 m)   Wt 201 lb (91.2 kg)   BMI 30.56 kg/m  , BMI Body mass index is 30.56 kg/m.  GENERAL:  Well appearing NECK:  No jugular venous distention, waveform within normal limits, carotid upstroke brisk and symmetric, no bruits, no thyromegaly LUNGS:  Clear to auscultation bilaterally CHEST:  Unremarkable HEART:  PMI not displaced or sustained,S1  and S2 within normal limits, no S3, no S4, no clicks, no rubs, no murmurs ABD:  Flat, positive bowel sounds normal in frequency in pitch, no bruits, no rebound, no guarding, no midline pulsatile mass, no hepatomegaly, no splenomegaly EXT:  2 plus pulses throughout, no edema, no cyanosis no clubbing   EKG:  EKG is  ordered today. The ekg ordered today demonstrates sinus rhythm, rate 68, axis within normal limits, poor anterior R wave progression, PVCs    Recent Labs: No results found for requested labs within last 8760 hours.    Lipid Panel No results found for: CHOL, TRIG, HDL, CHOLHDL, VLDL, LDLCALC, LDLDIRECT    Wt Readings from Last 3 Encounters:  01/26/17 201 lb (91.2 kg)  05/19/16 204 lb (92.5 kg)  06/15/15 198 lb (89.8 kg)      Other studies Reviewed: Additional studies/ records that were reviewed today include:  none Review of the above records demonstrates:  Please see elsewhere in the note.     ASSESSMENT AND PLAN:  CORONARY CALCIUM:  He had a negative POET (Plain Old Exercise Treadmill). I will follow up with this in Jan of next year.    PULMONARY NODULE:  He is not high risk for lung cancer so no follow-up is suggested.    PVCs:  He does not feel these.  No change in therapy.   HTN:   The blood pressure is at target. No change in medications is indicated. We will continue with therapeutic lifestyle changes (TLC).  The reading today is not his usual.    PALPITATIONS:  I need to try to capture this.  I have suggested the Alive Cor  Current medicines are reviewed at length with the patient today.  The patient does not have concerns regarding medicines.  The following changes have been made:  None  Labs/ tests ordered today include:    Orders Placed This Encounter  Procedures  . Exercise Tolerance Test  . EKG 12-Lead     Disposition:   FU with me in Jan.  Signed, Minus Breeding, MD  01/27/2017 4:08 PM    [facility]

## 2017-01-26 ENCOUNTER — Ambulatory Visit (INDEPENDENT_AMBULATORY_CARE_PROVIDER_SITE_OTHER): Payer: PPO | Admitting: Cardiology

## 2017-01-26 ENCOUNTER — Encounter: Payer: Self-pay | Admitting: Cardiology

## 2017-01-26 VITALS — BP 148/75 | HR 63 | Ht 68.0 in | Wt 201.0 lb

## 2017-01-26 DIAGNOSIS — R931 Abnormal findings on diagnostic imaging of heart and coronary circulation: Secondary | ICD-10-CM | POA: Diagnosis not present

## 2017-01-26 DIAGNOSIS — I493 Ventricular premature depolarization: Secondary | ICD-10-CM

## 2017-01-26 NOTE — Patient Instructions (Addendum)
Medication Instructions:  Continue current Medications  Labwork: None Ordered  Testing/Procedures: Your physician has requested that you have an exercise tolerance test in January. For further information please visit [email or web address]. Please also follow instruction sheet, as given.   Follow-Up: Your physician recommends that you schedule a follow-up appointment in: January after stress test   Any Other Special Instructions Will Be Listed Below (If Applicable).  ALIVECOR   If you need a refill on your cardiac medications before your next appointment, please call your pharmacy.

## 2017-01-27 ENCOUNTER — Encounter: Payer: Self-pay | Admitting: Cardiology

## 2017-01-27 DIAGNOSIS — I493 Ventricular premature depolarization: Secondary | ICD-10-CM

## 2017-01-27 HISTORY — DX: Ventricular premature depolarization: I49.3

## 2017-02-14 DIAGNOSIS — M1812 Unilateral primary osteoarthritis of first carpometacarpal joint, left hand: Secondary | ICD-10-CM | POA: Diagnosis not present

## 2017-02-14 DIAGNOSIS — M654 Radial styloid tenosynovitis [de Quervain]: Secondary | ICD-10-CM | POA: Diagnosis not present

## 2017-02-15 DIAGNOSIS — Z85828 Personal history of other malignant neoplasm of skin: Secondary | ICD-10-CM | POA: Diagnosis not present

## 2017-02-15 DIAGNOSIS — L570 Actinic keratosis: Secondary | ICD-10-CM | POA: Diagnosis not present

## 2017-02-15 DIAGNOSIS — L821 Other seborrheic keratosis: Secondary | ICD-10-CM | POA: Diagnosis not present

## 2017-02-15 DIAGNOSIS — L812 Freckles: Secondary | ICD-10-CM | POA: Diagnosis not present

## 2017-02-15 DIAGNOSIS — L239 Allergic contact dermatitis, unspecified cause: Secondary | ICD-10-CM | POA: Diagnosis not present

## 2017-03-06 DIAGNOSIS — E7849 Other hyperlipidemia: Secondary | ICD-10-CM | POA: Diagnosis not present

## 2017-03-06 DIAGNOSIS — Z125 Encounter for screening for malignant neoplasm of prostate: Secondary | ICD-10-CM | POA: Diagnosis not present

## 2017-03-06 DIAGNOSIS — R7301 Impaired fasting glucose: Secondary | ICD-10-CM | POA: Diagnosis not present

## 2017-03-06 DIAGNOSIS — Z0000 Encounter for general adult medical examination without abnormal findings: Secondary | ICD-10-CM | POA: Diagnosis not present

## 2017-03-06 DIAGNOSIS — I10 Essential (primary) hypertension: Secondary | ICD-10-CM | POA: Diagnosis not present

## 2017-03-13 DIAGNOSIS — Z0000 Encounter for general adult medical examination without abnormal findings: Secondary | ICD-10-CM | POA: Diagnosis not present

## 2017-03-13 DIAGNOSIS — Z6829 Body mass index (BMI) 29.0-29.9, adult: Secondary | ICD-10-CM | POA: Diagnosis not present

## 2017-03-13 DIAGNOSIS — Z23 Encounter for immunization: Secondary | ICD-10-CM | POA: Diagnosis not present

## 2017-03-13 DIAGNOSIS — Z1389 Encounter for screening for other disorder: Secondary | ICD-10-CM | POA: Diagnosis not present

## 2017-03-13 DIAGNOSIS — R740 Nonspecific elevation of levels of transaminase and lactic acid dehydrogenase [LDH]: Secondary | ICD-10-CM | POA: Diagnosis not present

## 2017-03-13 DIAGNOSIS — R7301 Impaired fasting glucose: Secondary | ICD-10-CM | POA: Diagnosis not present

## 2017-03-13 DIAGNOSIS — I10 Essential (primary) hypertension: Secondary | ICD-10-CM | POA: Diagnosis not present

## 2017-03-13 DIAGNOSIS — G5600 Carpal tunnel syndrome, unspecified upper limb: Secondary | ICD-10-CM | POA: Diagnosis not present

## 2017-03-13 DIAGNOSIS — E7849 Other hyperlipidemia: Secondary | ICD-10-CM | POA: Diagnosis not present

## 2017-03-13 DIAGNOSIS — E8881 Metabolic syndrome: Secondary | ICD-10-CM | POA: Diagnosis not present

## 2017-03-13 DIAGNOSIS — I2510 Atherosclerotic heart disease of native coronary artery without angina pectoris: Secondary | ICD-10-CM | POA: Diagnosis not present

## 2017-03-13 DIAGNOSIS — R002 Palpitations: Secondary | ICD-10-CM | POA: Diagnosis not present

## 2017-03-16 ENCOUNTER — Telehealth: Payer: Self-pay | Admitting: *Deleted

## 2017-03-16 NOTE — Telephone Encounter (Signed)
Left message for patient to call and schedule visit with Dr. Percival Spanish after his treadmill in January 2019.

## 2017-03-19 DIAGNOSIS — Z1212 Encounter for screening for malignant neoplasm of rectum: Secondary | ICD-10-CM | POA: Diagnosis not present

## 2017-03-19 LAB — IFOBT (OCCULT BLOOD): IFOBT: NEGATIVE

## 2017-04-18 DIAGNOSIS — G5601 Carpal tunnel syndrome, right upper limb: Secondary | ICD-10-CM | POA: Diagnosis not present

## 2017-04-23 DIAGNOSIS — G5602 Carpal tunnel syndrome, left upper limb: Secondary | ICD-10-CM | POA: Diagnosis not present

## 2017-04-23 DIAGNOSIS — G5601 Carpal tunnel syndrome, right upper limb: Secondary | ICD-10-CM | POA: Diagnosis not present

## 2017-04-23 DIAGNOSIS — M654 Radial styloid tenosynovitis [de Quervain]: Secondary | ICD-10-CM | POA: Diagnosis not present

## 2017-04-27 DIAGNOSIS — G5602 Carpal tunnel syndrome, left upper limb: Secondary | ICD-10-CM | POA: Diagnosis not present

## 2017-04-28 HISTORY — PX: CARPAL TUNNEL RELEASE: SHX101

## 2017-05-10 DIAGNOSIS — G5601 Carpal tunnel syndrome, right upper limb: Secondary | ICD-10-CM | POA: Diagnosis not present

## 2017-05-10 DIAGNOSIS — G5602 Carpal tunnel syndrome, left upper limb: Secondary | ICD-10-CM | POA: Diagnosis not present

## 2017-06-07 ENCOUNTER — Telehealth (HOSPITAL_COMMUNITY): Payer: Self-pay

## 2017-06-07 NOTE — Telephone Encounter (Signed)
Encounter complete. 

## 2017-06-12 ENCOUNTER — Encounter (HOSPITAL_COMMUNITY): Payer: Self-pay | Admitting: *Deleted

## 2017-06-12 ENCOUNTER — Ambulatory Visit (HOSPITAL_COMMUNITY)
Admission: RE | Admit: 2017-06-12 | Discharge: 2017-06-12 | Disposition: A | Discharge status: Home / Self Care | Payer: PPO | Source: Ambulatory Visit | Attending: Cardiovascular Disease | Admitting: Cardiovascular Disease

## 2017-06-12 DIAGNOSIS — I493 Ventricular premature depolarization: Secondary | ICD-10-CM | POA: Insufficient documentation

## 2017-06-12 DIAGNOSIS — R931 Abnormal findings on diagnostic imaging of heart and coronary circulation: Secondary | ICD-10-CM

## 2017-06-12 LAB — EXERCISE TOLERANCE TEST
CHL CUP MPHR: 151 {beats}/min
CSEPEDS: 30 s
CSEPEW: 10.9 METS
CSEPPHR: 131 {beats}/min
Exercise duration (min): 9 min
Percent HR: 86 %
RPE: 18
Rest HR: 56 {beats}/min

## 2017-06-12 NOTE — Progress Notes (Unsigned)
Abnormal ETT was reviewed by DR. Martinique. Patient was given the ok to be discharged to go home.

## 2017-07-12 ENCOUNTER — Telehealth: Payer: Self-pay | Admitting: *Deleted

## 2017-07-12 ENCOUNTER — Telehealth: Payer: Self-pay | Admitting: Cardiology

## 2017-07-12 DIAGNOSIS — R931 Abnormal findings on diagnostic imaging of heart and coronary circulation: Secondary | ICD-10-CM

## 2017-07-12 DIAGNOSIS — Z79899 Other long term (current) drug therapy: Secondary | ICD-10-CM

## 2017-07-12 DIAGNOSIS — R9439 Abnormal result of other cardiovascular function study: Secondary | ICD-10-CM

## 2017-07-12 MED ORDER — METOPROLOL TARTRATE 50 MG PO TABS: 50.0000 mg | ORAL_TABLET | Freq: Two times a day (BID) | ORAL | 0 refills | Status: DC

## 2017-07-12 NOTE — Telephone Encounter (Signed)
-----   Message from Minus Breeding, MD sent at 07/09/2017 12:32 PM EST ----- Coronary calcium and abnormal stress test.  Needs coronary angiogram.  I will order a CT coronary angiogram.  Discussed with the patient.

## 2017-07-12 NOTE — Telephone Encounter (Signed)
Your physician has requested that you have cardiac CT. Cardiac computed tomography (CT) is a painless test that uses an x-ray machine to take clear, detailed pictures of your heart. For further information please visit [email or web address]. Please follow instruction sheet as given.   Letter with blood work and instruction mailed to pt  Please arrive at the [employer] main entrance of [facility] at        AM (30-45 minutes prior to test start time)  [facility] [place], [place] [address] [phone]  Proceed to the [facility] Radiology Department (First Floor).  Please follow these instructions carefully (unless otherwise directed):  Hold all erectile dysfunction medications at least 48 hours prior to test.  On the Night Before the Test: . Drink plenty of water. . Do not consume any caffeinated/decaffeinated beverages or chocolate 12 hours prior to your test. . Do not take any antihistamines 12 hours prior to your test. . If you take Metformin do not take 24 hours prior to test.   On the Day of the Test: . Drink plenty of water. Do not drink any water within one hour of the test. . Do not eat any food 4 hours prior to the test. . You may take your regular medications prior to the test. . IF NOT ON A BETA BLOCKER - Take 50 mg of lopressor (metoprolol) one hour before the test. . HOLD Furosemide morning of the test.  After the Test: . Drink plenty of water. . After receiving IV contrast, you may experience a mild flushed feeling. This is normal. . On occasion, you may experience a mild rash up to 24 hours after the test. This is not dangerous. If this occurs, you can take Benadryl 25 mg and increase your fluid intake. . If you experience trouble breathing, this can be serious. If it is severe call 911 IMMEDIATELY. If it is mild, please call our office. . If you take any of these medications: Glipizide/Metformin, Avandament, Glucavance, please do  not take 48 hours after completing test.

## 2017-07-12 NOTE — Telephone Encounter (Signed)
Call and spoke with pharmacist at [facility], letting her know rx is for 1 tablet take only only for procedure.Marland Kitchen

## 2017-07-12 NOTE — Telephone Encounter (Signed)
Theatre manager( [place]) is calling about a script they just received for Metoprolol Tartrate 50 mg and the prescription was written for 1 tablet in which he is to take one tablet twice a day and she is calling to check on the quantity . Please call

## 2017-08-07 DIAGNOSIS — M25571 Pain in right ankle and joints of right foot: Secondary | ICD-10-CM | POA: Diagnosis not present

## 2017-08-07 DIAGNOSIS — G5602 Carpal tunnel syndrome, left upper limb: Secondary | ICD-10-CM | POA: Diagnosis not present

## 2017-08-07 DIAGNOSIS — G5601 Carpal tunnel syndrome, right upper limb: Secondary | ICD-10-CM | POA: Diagnosis not present

## 2017-08-08 ENCOUNTER — Emergency Department (HOSPITAL_COMMUNITY): Payer: PPO

## 2017-08-08 ENCOUNTER — Observation Stay (HOSPITAL_COMMUNITY)
Admission: EM | Admit: 2017-08-08 | Discharge: 2017-08-09 | Disposition: A | Discharge status: Home / Self Care | Payer: PPO | Attending: General Surgery | Admitting: General Surgery

## 2017-08-08 ENCOUNTER — Emergency Department (HOSPITAL_COMMUNITY): Payer: PPO | Admitting: Anesthesiology

## 2017-08-08 ENCOUNTER — Encounter (HOSPITAL_COMMUNITY)
Admission: EM | Disposition: A | Discharge status: Home / Self Care | Payer: Self-pay | Source: Home / Self Care | Attending: Emergency Medicine

## 2017-08-08 ENCOUNTER — Observation Stay (HOSPITAL_COMMUNITY): Payer: PPO

## 2017-08-08 ENCOUNTER — Encounter (HOSPITAL_COMMUNITY): Payer: Self-pay | Admitting: Emergency Medicine

## 2017-08-08 DIAGNOSIS — S61213A Laceration without foreign body of left middle finger without damage to nail, initial encounter: Secondary | ICD-10-CM | POA: Diagnosis not present

## 2017-08-08 DIAGNOSIS — Z7982 Long term (current) use of aspirin: Secondary | ICD-10-CM | POA: Diagnosis not present

## 2017-08-08 DIAGNOSIS — S66323A Laceration of extensor muscle, fascia and tendon of left middle finger at wrist and hand level, initial encounter: Secondary | ICD-10-CM | POA: Diagnosis not present

## 2017-08-08 DIAGNOSIS — S61215A Laceration without foreign body of left ring finger without damage to nail, initial encounter: Secondary | ICD-10-CM | POA: Insufficient documentation

## 2017-08-08 DIAGNOSIS — Z79899 Other long term (current) drug therapy: Secondary | ICD-10-CM | POA: Diagnosis not present

## 2017-08-08 DIAGNOSIS — E785 Hyperlipidemia, unspecified: Secondary | ICD-10-CM | POA: Diagnosis not present

## 2017-08-08 DIAGNOSIS — S63222A Subluxation of unspecified interphalangeal joint of right middle finger, initial encounter: Secondary | ICD-10-CM | POA: Diagnosis not present

## 2017-08-08 DIAGNOSIS — S3991XA Unspecified injury of abdomen, initial encounter: Secondary | ICD-10-CM | POA: Diagnosis not present

## 2017-08-08 DIAGNOSIS — S21111A Laceration without foreign body of right front wall of thorax without penetration into thoracic cavity, initial encounter: Secondary | ICD-10-CM | POA: Diagnosis not present

## 2017-08-08 DIAGNOSIS — S2231XA Fracture of one rib, right side, initial encounter for closed fracture: Secondary | ICD-10-CM | POA: Diagnosis not present

## 2017-08-08 DIAGNOSIS — S63283A Dislocation of proximal interphalangeal joint of left middle finger, initial encounter: Secondary | ICD-10-CM | POA: Insufficient documentation

## 2017-08-08 DIAGNOSIS — I10 Essential (primary) hypertension: Secondary | ICD-10-CM | POA: Insufficient documentation

## 2017-08-08 DIAGNOSIS — R0600 Dyspnea, unspecified: Secondary | ICD-10-CM | POA: Diagnosis not present

## 2017-08-08 DIAGNOSIS — S1989XA Other specified injuries of other specified part of neck, initial encounter: Secondary | ICD-10-CM | POA: Diagnosis not present

## 2017-08-08 DIAGNOSIS — S21119A Laceration without foreign body of unspecified front wall of thorax without penetration into thoracic cavity, initial encounter: Secondary | ICD-10-CM | POA: Diagnosis present

## 2017-08-08 DIAGNOSIS — S2190XA Unspecified open wound of unspecified part of thorax, initial encounter: Secondary | ICD-10-CM | POA: Diagnosis not present

## 2017-08-08 DIAGNOSIS — R2681 Unsteadiness on feet: Secondary | ICD-10-CM | POA: Insufficient documentation

## 2017-08-08 DIAGNOSIS — S61219A Laceration without foreign body of unspecified finger without damage to nail, initial encounter: Secondary | ICD-10-CM

## 2017-08-08 DIAGNOSIS — S3993XA Unspecified injury of pelvis, initial encounter: Secondary | ICD-10-CM | POA: Diagnosis not present

## 2017-08-08 DIAGNOSIS — S63289A Dislocation of proximal interphalangeal joint of unspecified finger, initial encounter: Secondary | ICD-10-CM

## 2017-08-08 HISTORY — PX: IRRIGATION AND DEBRIDEMENT ABSCESS: SHX5252

## 2017-08-08 HISTORY — DX: Laceration without foreign body of unspecified front wall of thorax without penetration into thoracic cavity, initial encounter: S21.119A

## 2017-08-08 HISTORY — PX: MINOR IRRIGATION AND DEBRIDEMENT OF WOUND: SHX6239

## 2017-08-08 LAB — COMPREHENSIVE METABOLIC PANEL
ALK PHOS: 59 U/L (ref 38–126)
ALT: 43 U/L (ref 17–63)
ANION GAP: 12 (ref 5–15)
AST: 57 U/L — ABNORMAL HIGH (ref 15–41)
Albumin: 4.1 g/dL (ref 3.5–5.0)
BILIRUBIN TOTAL: 0.8 mg/dL (ref 0.3–1.2)
BUN: 18 mg/dL (ref 6–20)
CO2: 23 mmol/L (ref 22–32)
Calcium: 9.2 mg/dL (ref 8.9–10.3)
Chloride: 99 mmol/L — ABNORMAL LOW (ref 101–111)
Creatinine, Ser: 1.17 mg/dL (ref 0.61–1.24)
GFR calc non Af Amer: >60 mL/min (ref >60)
Glucose, Bld: 105 mg/dL — ABNORMAL HIGH (ref 65–99)
Potassium: 3.4 mmol/L — ABNORMAL LOW (ref 3.5–5.1)
SODIUM: 134 mmol/L — AB (ref 135–145)
TOTAL PROTEIN: 7.4 g/dL (ref 6.5–8.1)

## 2017-08-08 LAB — PREPARE FRESH FROZEN PLASMA
Unit division: 0
Unit division: 0

## 2017-08-08 LAB — TYPE AND SCREEN
ABO/RH(D): O POS
Antibody Screen: NEGATIVE
UNIT DIVISION: 0
UNIT DIVISION: 0

## 2017-08-08 LAB — I-STAT CHEM 8, ED
BUN: 22 mg/dL — ABNORMAL HIGH (ref 6–20)
CALCIUM ION: 1.08 mmol/L — AB (ref 1.15–1.40)
CHLORIDE: 99 mmol/L — AB (ref 101–111)
Creatinine, Ser: 1.1 mg/dL (ref 0.61–1.24)
GLUCOSE: 104 mg/dL — AB (ref 65–99)
HCT: 43 % (ref 39.0–52.0)
Hemoglobin: 14.6 g/dL (ref 13.0–17.0)
Potassium: 3.4 mmol/L — ABNORMAL LOW (ref 3.5–5.1)
Sodium: 136 mmol/L (ref 135–145)
TCO2: 24 mmol/L (ref 22–32)

## 2017-08-08 LAB — BPAM RBC
BLOOD PRODUCT EXPIRATION DATE: 201903252359
Blood Product Expiration Date: 201904072359
ISSUE DATE / TIME: 201903131016
ISSUE DATE / TIME: 201903131016
UNIT TYPE AND RH: 9500
Unit Type and Rh: 9500

## 2017-08-08 LAB — BPAM FFP
Blood Product Expiration Date: 201903132359
Blood Product Expiration Date: 201903132359
ISSUE DATE / TIME: 201903131016
ISSUE DATE / TIME: 201903131016
Unit Type and Rh: 6200
Unit Type and Rh: 6200

## 2017-08-08 LAB — CBC
HCT: 43.3 % (ref 39.0–52.0)
HEMOGLOBIN: 14.4 g/dL (ref 13.0–17.0)
MCH: 30.4 pg (ref 26.0–34.0)
MCHC: 33.3 g/dL (ref 30.0–36.0)
MCV: 91.4 fL (ref 78.0–100.0)
Platelets: 197 10*3/uL (ref 150–400)
RBC: 4.74 MIL/uL (ref 4.22–5.81)
RDW: 13.2 % (ref 11.5–15.5)
WBC: 10.1 10*3/uL (ref 4.0–10.5)

## 2017-08-08 LAB — PROTIME-INR
INR: 1.01
PROTHROMBIN TIME: 13.2 s (ref 11.4–15.2)

## 2017-08-08 LAB — CDS SEROLOGY

## 2017-08-08 LAB — I-STAT CG4 LACTIC ACID, ED: LACTIC ACID, VENOUS: 3.3 mmol/L — AB (ref 0.5–1.9)

## 2017-08-08 LAB — ABO/RH: ABO/RH(D): O POS

## 2017-08-08 LAB — ETHANOL: Alcohol, Ethyl (B): <10 mg/dL (ref <10)

## 2017-08-08 SURGERY — IRRIGATION AND DEBRIDEMENT ABSCESS
Anesthesia: General | Site: Finger | Laterality: Left

## 2017-08-08 MED ORDER — ROSUVASTATIN CALCIUM 10 MG PO TABS
10.0000 mg | ORAL_TABLET | Freq: Every day | ORAL | Status: DC
  Administered 2017-08-08: 10 mg via ORAL
  Filled 2017-08-08: qty 1

## 2017-08-08 MED ORDER — 0.9 % SODIUM CHLORIDE (POUR BTL) OPTIME
TOPICAL | Status: DC | PRN
  Administered 2017-08-08: 1000 mL

## 2017-08-08 MED ORDER — PROPOFOL 10 MG/ML IV BOLUS
INTRAVENOUS | Status: DC | PRN
  Administered 2017-08-08: 150 mg via INTRAVENOUS

## 2017-08-08 MED ORDER — HYDROMORPHONE HCL 1 MG/ML IJ SOLN
INTRAMUSCULAR | Status: AC
  Filled 2017-08-08: qty 1

## 2017-08-08 MED ORDER — MORPHINE SULFATE (PF) 2 MG/ML IV SOLN
1.0000 mg | INTRAVENOUS | Status: DC | PRN
  Administered 2017-08-08 – 2017-08-09 (×2): 2 mg via INTRAVENOUS
  Filled 2017-08-08 (×2): qty 1

## 2017-08-08 MED ORDER — HYDROMORPHONE HCL 1 MG/ML IJ SOLN
0.2500 mg | INTRAMUSCULAR | Status: DC | PRN
  Administered 2017-08-08 (×2): 0.5 mg via INTRAVENOUS

## 2017-08-08 MED ORDER — ONDANSETRON HCL 4 MG/2ML IJ SOLN
INTRAMUSCULAR | Status: DC | PRN
  Administered 2017-08-08: 4 mg via INTRAVENOUS

## 2017-08-08 MED ORDER — SUCCINYLCHOLINE CHLORIDE 20 MG/ML IJ SOLN
INTRAMUSCULAR | Status: DC | PRN
  Administered 2017-08-08: 100 mg via INTRAVENOUS

## 2017-08-08 MED ORDER — DOCUSATE SODIUM 100 MG PO CAPS
100.0000 mg | ORAL_CAPSULE | Freq: Two times a day (BID) | ORAL | Status: DC
  Administered 2017-08-08 – 2017-08-09 (×2): 100 mg via ORAL
  Filled 2017-08-08 (×2): qty 1

## 2017-08-08 MED ORDER — METOPROLOL TARTRATE 5 MG/5ML IV SOLN: 5.0000 mg | Freq: Four times a day (QID) | INTRAVENOUS | Status: DC | PRN

## 2017-08-08 MED ORDER — AMLODIPINE BESYLATE 2.5 MG PO TABS
2.5000 mg | ORAL_TABLET | Freq: Every day | ORAL | Status: DC
  Administered 2017-08-08 – 2017-08-09 (×2): 2.5 mg via ORAL
  Filled 2017-08-08 (×2): qty 1

## 2017-08-08 MED ORDER — FENTANYL CITRATE (PF) 100 MCG/2ML IJ SOLN
INTRAMUSCULAR | Status: DC | PRN
  Administered 2017-08-08 (×2): 100 ug via INTRAVENOUS
  Administered 2017-08-08: 50 ug via INTRAVENOUS

## 2017-08-08 MED ORDER — ONDANSETRON HCL 4 MG/2ML IJ SOLN: 4.0000 mg | Freq: Four times a day (QID) | INTRAMUSCULAR | Status: DC | PRN

## 2017-08-08 MED ORDER — POTASSIUM CHLORIDE IN NACL 40-0.9 MEQ/L-% IV SOLN
INTRAVENOUS | Status: DC
  Administered 2017-08-08 – 2017-08-09 (×2): 50 mL/h via INTRAVENOUS
  Filled 2017-08-08: qty 1000

## 2017-08-08 MED ORDER — PROMETHAZINE HCL 25 MG/ML IJ SOLN: 6.2500 mg | INTRAMUSCULAR | Status: DC | PRN

## 2017-08-08 MED ORDER — CEFAZOLIN SODIUM-DEXTROSE 2-4 GM/100ML-% IV SOLN
2.0000 g | Freq: Once | INTRAVENOUS | Status: AC
  Administered 2017-08-08: 2 g via INTRAVENOUS

## 2017-08-08 MED ORDER — METHOCARBAMOL 500 MG PO TABS
500.0000 mg | ORAL_TABLET | Freq: Three times a day (TID) | ORAL | Status: DC | PRN
  Administered 2017-08-08 – 2017-08-09 (×2): 500 mg via ORAL
  Filled 2017-08-08: qty 1

## 2017-08-08 MED ORDER — ENOXAPARIN SODIUM 40 MG/0.4ML ~~LOC~~ SOLN
40.0000 mg | SUBCUTANEOUS | Status: DC
  Administered 2017-08-08: 40 mg via SUBCUTANEOUS
  Filled 2017-08-08: qty 0.4

## 2017-08-08 MED ORDER — METHOCARBAMOL 500 MG PO TABS
ORAL_TABLET | ORAL | Status: AC
  Filled 2017-08-08: qty 1

## 2017-08-08 MED ORDER — MEPERIDINE HCL 50 MG/ML IJ SOLN: 6.2500 mg | INTRAMUSCULAR | Status: DC | PRN

## 2017-08-08 MED ORDER — SODIUM CHLORIDE 0.9 % IR SOLN
Status: DC | PRN
  Administered 2017-08-08: 3000 mL

## 2017-08-08 MED ORDER — LIDOCAINE HCL (CARDIAC) 20 MG/ML IV SOLN
INTRAVENOUS | Status: DC | PRN
  Administered 2017-08-08: 100 mg via INTRAVENOUS

## 2017-08-08 MED ORDER — IOPAMIDOL (ISOVUE-300) INJECTION 61%
INTRAVENOUS | Status: AC
  Administered 2017-08-08: 100 mL
  Filled 2017-08-08: qty 100

## 2017-08-08 MED ORDER — OXYCODONE HCL 5 MG PO TABS
5.0000 mg | ORAL_TABLET | ORAL | Status: DC | PRN
  Administered 2017-08-08 (×2): 5 mg via ORAL
  Filled 2017-08-08 (×3): qty 1

## 2017-08-08 MED ORDER — TRAMADOL HCL 50 MG PO TABS
50.0000 mg | ORAL_TABLET | Freq: Four times a day (QID) | ORAL | Status: DC | PRN
  Administered 2017-08-09: 50 mg via ORAL
  Filled 2017-08-08: qty 1

## 2017-08-08 MED ORDER — ROCURONIUM BROMIDE 100 MG/10ML IV SOLN
INTRAVENOUS | Status: DC | PRN
  Administered 2017-08-08: 40 mg via INTRAVENOUS

## 2017-08-08 MED ORDER — LACTATED RINGERS IV SOLN
INTRAVENOUS | Status: DC | PRN
  Administered 2017-08-08 (×3): via INTRAVENOUS

## 2017-08-08 MED ORDER — POVIDONE-IODINE 10 % EX OINT
TOPICAL_OINTMENT | CUTANEOUS | Status: DC | PRN
  Administered 2017-08-08: 1 via TOPICAL

## 2017-08-08 MED ORDER — OXYCODONE HCL 5 MG PO TABS
ORAL_TABLET | ORAL | Status: AC
  Filled 2017-08-08: qty 1

## 2017-08-08 MED ORDER — MIDAZOLAM HCL 5 MG/5ML IJ SOLN
INTRAMUSCULAR | Status: DC | PRN
  Administered 2017-08-08: 2 mg via INTRAVENOUS

## 2017-08-08 MED ORDER — ONDANSETRON 4 MG PO TBDP
4.0000 mg | ORAL_TABLET | Freq: Four times a day (QID) | ORAL | Status: DC | PRN
  Filled 2017-08-08: qty 1

## 2017-08-08 MED ORDER — EPHEDRINE SULFATE 50 MG/ML IJ SOLN
INTRAMUSCULAR | Status: DC | PRN
  Administered 2017-08-08: 10 mg via INTRAVENOUS

## 2017-08-08 MED ORDER — CEFAZOLIN SODIUM-DEXTROSE 2-4 GM/100ML-% IV SOLN
2.0000 g | Freq: Three times a day (TID) | INTRAVENOUS | Status: DC
  Administered 2017-08-08 – 2017-08-09 (×3): 2 g via INTRAVENOUS
  Filled 2017-08-08 (×4): qty 100

## 2017-08-08 MED ORDER — SUGAMMADEX SODIUM 200 MG/2ML IV SOLN
INTRAVENOUS | Status: DC | PRN
  Administered 2017-08-08: 172.4 mg via INTRAVENOUS

## 2017-08-08 MED ORDER — HYDRALAZINE HCL 20 MG/ML IJ SOLN: 10.0000 mg | INTRAMUSCULAR | Status: DC | PRN

## 2017-08-08 MED ORDER — ACETAMINOPHEN 325 MG PO TABS
650.0000 mg | ORAL_TABLET | Freq: Four times a day (QID) | ORAL | Status: DC | PRN
  Administered 2017-08-08: 650 mg via ORAL
  Filled 2017-08-08: qty 2

## 2017-08-08 MED ORDER — POVIDONE-IODINE 10 % EX OINT
TOPICAL_OINTMENT | CUTANEOUS | Status: AC
  Filled 2017-08-08: qty 28.35

## 2017-08-08 MED ORDER — CEFAZOLIN SODIUM-DEXTROSE 2-4 GM/100ML-% IV SOLN
INTRAVENOUS | Status: AC
  Administered 2017-08-08: 2 g via INTRAVENOUS
  Filled 2017-08-08: qty 100

## 2017-08-08 SURGICAL SUPPLY — 45 items
BNDG GAUZE ELAST 4 BULKY (GAUZE/BANDAGES/DRESSINGS) ×2 IMPLANT
CANISTER SUCT 3000ML PPV (MISCELLANEOUS) ×4 IMPLANT
COVER SURGICAL LIGHT HANDLE (MISCELLANEOUS) ×2 IMPLANT
DRAPE LAPAROSCOPIC ABDOMINAL (DRAPES) IMPLANT
DRAPE LAPAROTOMY 100X72 PEDS (DRAPES) ×2 IMPLANT
DRAPE UTILITY XL STRL (DRAPES) ×8 IMPLANT
DRSG PAD ABDOMINAL 8X10 ST (GAUZE/BANDAGES/DRESSINGS) ×2 IMPLANT
ELECT CAUTERY BLADE 6.4 (BLADE) ×4 IMPLANT
ELECT REM PT RETURN 9FT ADLT (ELECTROSURGICAL) ×4
ELECTRODE REM PT RTRN 9FT ADLT (ELECTROSURGICAL) ×2 IMPLANT
GAUZE SPONGE 4X4 12PLY STRL (GAUZE/BANDAGES/DRESSINGS) IMPLANT
GAUZE SPONGE 4X4 12PLY STRL LF (GAUZE/BANDAGES/DRESSINGS) ×2 IMPLANT
GAUZE XEROFORM 1X8 LF (GAUZE/BANDAGES/DRESSINGS) ×2 IMPLANT
GAUZE XEROFORM 5X9 LF (GAUZE/BANDAGES/DRESSINGS) ×2 IMPLANT
GLOVE BIO SURGEON STRL SZ 6.5 (GLOVE) ×1 IMPLANT
GLOVE BIO SURGEON STRL SZ7 (GLOVE) ×2 IMPLANT
GLOVE BIO SURGEON STRL SZ8 (GLOVE) ×2 IMPLANT
GLOVE BIO SURGEONS STRL SZ 6.5 (GLOVE) ×1
GLOVE BIOGEL PI IND STRL 6.5 (GLOVE) IMPLANT
GLOVE BIOGEL PI IND STRL 7.0 (GLOVE) IMPLANT
GLOVE BIOGEL PI IND STRL 7.5 (GLOVE) IMPLANT
GLOVE BIOGEL PI IND STRL 8 (GLOVE) ×2 IMPLANT
GLOVE BIOGEL PI INDICATOR 6.5 (GLOVE) ×4
GLOVE BIOGEL PI INDICATOR 7.0 (GLOVE) ×8
GLOVE BIOGEL PI INDICATOR 7.5 (GLOVE) ×4
GLOVE BIOGEL PI INDICATOR 8 (GLOVE) ×6
GLOVE ECLIPSE 8.0 STRL XLNG CF (GLOVE) ×6 IMPLANT
GOWN STRL REUS W/ TWL LRG LVL3 (GOWN DISPOSABLE) ×4 IMPLANT
GOWN STRL REUS W/TWL LRG LVL3 (GOWN DISPOSABLE) ×16
KIT BASIN OR (CUSTOM PROCEDURE TRAY) ×4 IMPLANT
KIT ROOM TURNOVER OR (KITS) ×4 IMPLANT
NS IRRIG 1000ML POUR BTL (IV SOLUTION) ×4 IMPLANT
PACK GENERAL/GYN (CUSTOM PROCEDURE TRAY) ×4 IMPLANT
PAD ABD 8X10 STRL (GAUZE/BANDAGES/DRESSINGS) ×2 IMPLANT
PAD ARMBOARD 7.5X6 YLW CONV (MISCELLANEOUS) ×8 IMPLANT
SUT ETHILON 3 0 FSL (SUTURE) ×4 IMPLANT
SUT VIC AB 2-0 CT1 27 (SUTURE) ×8
SUT VIC AB 2-0 CT1 TAPERPNT 27 (SUTURE) IMPLANT
SUT VIC AB 3-0 CT1 27 (SUTURE) ×8
SUT VIC AB 3-0 CT1 TAPERPNT 27 (SUTURE) IMPLANT
SWAB COLLECTION DEVICE MRSA (MISCELLANEOUS) IMPLANT
SWAB CULTURE ESWAB REG 1ML (MISCELLANEOUS) IMPLANT
TAPE CLOTH SURG 4X10 WHT LF (GAUZE/BANDAGES/DRESSINGS) ×2 IMPLANT
TOWEL OR 17X24 6PK STRL BLUE (TOWEL DISPOSABLE) ×4 IMPLANT
TOWEL OR 17X26 10 PK STRL BLUE (TOWEL DISPOSABLE) ×4 IMPLANT

## 2017-08-08 NOTE — Procedures (Addendum)
Procedure: Left middle finger closed reduction PIP joint, repair of hand as necessary  Indication: Left middle finger open PIP joint dislocation  Surgeon: Silvestre Gunner, PA-C  Assist: None  Anesthesia: General  EBL: Mimimal  Complications: None  Findings: After risks/benefits explained patient desires to undergo procedure. Consent obtained and time out performed. Pt was found to have spontaneously reduced during prep. 1 liter NS irrigation used to irrigate wound under low pressure. Minimal debridement performed, laceration closed with 5-0 prolene sutures. Previously unseen laceration over dorsum of ring finger PIP joint closed with same. Procedure done in OR rather than ED 2/2 concurrent repair of complex chest wall laceration.    [name], PA-C Orthopedic Surgery [phone]
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
## 2017-08-08 NOTE — ED Notes (Signed)
Patient stated he believe he is up to date for his tetanus.

## 2017-08-08 NOTE — Progress Notes (Signed)
Orthopedic Tech Progress Note Patient Details:  Matthew Briggs April 06, 1946 [id number]  Ortho Devices Type of Ortho Device: Ace wrap, Volar splint Ortho Device/Splint Location: lue Ortho Device/Splint Interventions: Application   Post Interventions Patient Tolerated: Well Instructions Provided: Care of device   Hildred Priest 08/08/2017, 2:25 PM

## 2017-08-08 NOTE — Progress Notes (Signed)
   08/08/17 1200  Clinical Encounter Type  Visited With Patient  Visit Type Trauma  Referral From Nurse  Spiritual Encounters  Spiritual Needs Emotional  Stress Factors  Patient Stress Factors Health changes  Chaplain spoke with PT and received the wife contact info; between social worker and Chaplain wife was called 5x with no answer.
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
## 2017-08-08 NOTE — ED Notes (Signed)
Fast exam negative
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
## 2017-08-08 NOTE — ED Notes (Signed)
[facility] [facility] [place] [facility] Where wife met patient and talking with Doctor.

## 2017-08-08 NOTE — Anesthesia Preprocedure Evaluation (Signed)
Anesthesia Evaluation  Patient identified by MRN, date of birth, ID band Patient awake    Reviewed: Allergy & Precautions, NPO status , Patient's Chart, lab work & pertinent test results  Airway Mallampati: II  TM Distance: >3 FB Neck ROM: Full    Dental no notable dental hx.    Pulmonary neg pulmonary ROS,    Pulmonary exam normal breath sounds clear to auscultation       Cardiovascular negative cardio ROS Normal cardiovascular exam Rhythm:Regular Rate:Normal     Neuro/Psych negative neurological ROS  negative psych ROS   GI/Hepatic negative GI ROS, Neg liver ROS,   Endo/Other  negative endocrine ROS  Renal/GU negative Renal ROS     Musculoskeletal negative musculoskeletal ROS (+)   Abdominal   Peds  Hematology negative hematology ROS (+)   Anesthesia Other Findings   Reproductive/Obstetrics                             Anesthesia Physical Anesthesia Plan  ASA: II and emergent  Anesthesia Plan: General   Post-op Pain Management:    Induction: Intravenous  PONV Risk Score and Plan: 3 and Ondansetron, Dexamethasone and Treatment may vary due to age or medical condition  Airway Management Planned: Oral ETT  Additional Equipment:   Intra-op Plan:   Post-operative Plan: Extubation in OR  Informed Consent: I have reviewed the patients History and Physical, chart, labs and discussed the procedure including the risks, benefits and alternatives for the proposed anesthesia with the patient or authorized representative who has indicated his/her understanding and acceptance.   Dental advisory given  Plan Discussed with: CRNA  Anesthesia Plan Comments: (? Aline, cvl? Pt apparently stable)        Anesthesia Quick Evaluation

## 2017-08-08 NOTE — Anesthesia Postprocedure Evaluation (Signed)
Anesthesia Post Note  Patient: Matthew Briggs  Procedure(s) Performed: IRRIGATION AND DEBRIDEMENT OF CHEST (Chest) CLOSED REDUCTION REPAIR OF PIP JOINT/LACERATION LEFT MIDDLE FINGER (Left Finger)     Patient location during evaluation: PACU Anesthesia Type: General Level of consciousness: sedated and patient cooperative Pain management: pain level controlled Vital Signs Assessment: post-procedure vital signs reviewed and stable Respiratory status: spontaneous breathing Cardiovascular status: stable Anesthetic complications: no    Last Vitals:  Vitals:   08/08/17 1509 08/08/17 1538  BP: 120/72 127/70  Pulse: 70 (!) 59  Resp: 12 13  Temp:  (!) 36.3 C  SpO2: 97% 96%    Last Pain:  Vitals:   08/08/17 1538  TempSrc:   PainSc: [place]

## 2017-08-08 NOTE — Transfer of Care (Signed)
Immediate Anesthesia Transfer of Care Note  Patient: Matthew Briggs  Procedure(s) Performed: IRRIGATION AND DEBRIDEMENT OF CHEST (Chest) CLOSED REDUCTION REPAIR OF PIP JOINT/LACERATION LEFT MIDDLE FINGER (Left Finger)  Patient Location: PACU  Anesthesia Type:General  Level of Consciousness: sedated and patient cooperative  Airway & Oxygen Therapy: Patient Spontanous Breathing and Patient connected to nasal cannula oxygen  Post-op Assessment: Report given to RN and Post -op Vital signs reviewed and stable  Post vital signs: Reviewed and stable  Last Vitals:  Vitals:   08/08/17 1050 08/08/17 1310  BP: (!) 158/89 (!) 151/77  Pulse: 82 83  Resp: 18 17  Temp:    SpO2: 99% 98%    Last Pain:  Vitals:   08/08/17 1310  TempSrc:   PainSc: Asleep         Complications: No apparent anesthesia complications

## 2017-08-08 NOTE — ED Notes (Signed)
FAST EXAM by Dr Hulen Skains

## 2017-08-08 NOTE — Procedures (Signed)
Focused assessment sonogram for trauma  A 4 quadrant view of the abdomen starting with the epigastrium, right upper quadrant, left upper quadrant, and pelvis was performed.  There was no evidence of intra-abdominal fluid or bleeding.  Representative photographs were included with this note.       RUQ   PELVIC  LUQ  This patient has been seen and I agree with the findings and treatment plan.  Kathryne Eriksson. Dahlia Bailiff, MD, [facility] [phone] (pager) [phone] (direct pager) Trauma Surgeon
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
## 2017-08-08 NOTE — Anesthesia Procedure Notes (Signed)
Procedure Name: Intubation Date/Time: 08/08/2017 11:38 AM Performed by: Eligha Bridegroom, CRNA Pre-anesthesia Checklist: Patient identified, Emergency Drugs available, Suction available, Patient being monitored and Timeout performed Patient Re-evaluated:Patient Re-evaluated prior to induction Oxygen Delivery Method: Circle system utilized Preoxygenation: Pre-oxygenation with 100% oxygen Induction Type: IV induction, Rapid sequence and Cricoid Pressure applied Laryngoscope Size: Mac and 4 Grade View: Grade III Tube type: Oral Tube size: 7.5 mm Number of attempts: 1 Airway Equipment and Method: Stylet Placement Confirmation: ETT inserted through vocal cords under direct vision,  positive ETCO2 and breath sounds checked- equal and bilateral Secured at: 22 cm Tube secured with: Tape Dental Injury: Teeth and Oropharynx as per pre-operative assessment
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
## 2017-08-09 ENCOUNTER — Other Ambulatory Visit: Payer: Self-pay | Admitting: Orthopedic Surgery

## 2017-08-09 ENCOUNTER — Encounter (HOSPITAL_COMMUNITY): Payer: Self-pay | Admitting: General Surgery

## 2017-08-09 LAB — BASIC METABOLIC PANEL
ANION GAP: 11 (ref 5–15)
BUN: 16 mg/dL (ref 6–20)
CHLORIDE: 94 mmol/L — AB (ref 101–111)
CO2: 25 mmol/L (ref 22–32)
Calcium: 8 mg/dL — ABNORMAL LOW (ref 8.9–10.3)
Creatinine, Ser: 0.76 mg/dL (ref 0.61–1.24)
GFR calc Af Amer: >60 mL/min (ref >60)
Glucose, Bld: 122 mg/dL — ABNORMAL HIGH (ref 65–99)
POTASSIUM: 3.6 mmol/L (ref 3.5–5.1)
SODIUM: 130 mmol/L — AB (ref 135–145)

## 2017-08-09 LAB — CBC
HCT: 36.7 % — ABNORMAL LOW (ref 39.0–52.0)
Hemoglobin: 11.9 g/dL — ABNORMAL LOW (ref 13.0–17.0)
MCH: 29.7 pg (ref 26.0–34.0)
MCHC: 32.4 g/dL (ref 30.0–36.0)
MCV: 91.5 fL (ref 78.0–100.0)
PLATELETS: 152 10*3/uL (ref 150–400)
RBC: 4.01 MIL/uL — AB (ref 4.22–5.81)
RDW: 13.6 % (ref 11.5–15.5)
WBC: 7.9 10*3/uL (ref 4.0–10.5)

## 2017-08-09 LAB — BLOOD PRODUCT ORDER (VERBAL) VERIFICATION

## 2017-08-09 MED ORDER — METHOCARBAMOL 500 MG PO TABS: 500.0000 mg | ORAL_TABLET | Freq: Three times a day (TID) | ORAL | 0 refills | Status: DC | PRN

## 2017-08-09 MED ORDER — OXYCODONE HCL 5 MG PO TABS: 5.0000 mg | ORAL_TABLET | Freq: Four times a day (QID) | ORAL | 0 refills | Status: DC | PRN

## 2017-08-09 MED ORDER — TRAMADOL HCL 50 MG PO TABS: 50.0000 mg | ORAL_TABLET | Freq: Four times a day (QID) | ORAL | 0 refills | Status: AC | PRN

## 2017-08-09 MED ORDER — CEPHALEXIN 500 MG PO CAPS: 500.0000 mg | ORAL_CAPSULE | Freq: Three times a day (TID) | ORAL | 0 refills | Status: AC

## 2017-08-09 MED ORDER — DOCUSATE SODIUM 100 MG PO CAPS: 100.0000 mg | ORAL_CAPSULE | Freq: Two times a day (BID) | ORAL | 0 refills | Status: DC
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
## 2017-08-09 NOTE — Progress Notes (Signed)
Doing well this am, notes considerable pain in chest more so than digit Splint intact  May d/c home, Keflex written and on chart D/C instructions written Office will contact him to arrange next step as outpatient for next week.  Micheline Rough, MD Hand Surgery Mobile [phone]

## 2017-08-09 NOTE — Care Management Obs Status (Signed)
[place] NOTIFICATION   Patient Details  Name: Matthew Briggs MRN: [id number] Date of Birth: July 04, 1945   Medicare Observation Status Notification Given:  Yes    Carles Collet, RN 08/09/2017, 11:55 AM
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
## 2017-08-09 NOTE — Progress Notes (Signed)
Pt discharged home with wife. AVS and scripts given to and reviewed in full with patient and wife. All questions answered. All belongings sent with patient. VSS. BP 124/70 (BP Location: Right Arm)   Pulse 79   Temp 98.6 F (37 C) (Oral)   Resp 14   Ht 5' 11.65" (1.82 m)   Wt 86.2 kg (190 lb 0.6 oz)   SpO2 97%   BMI 26.02 kg/m
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
## 2017-08-09 NOTE — Discharge Instructions (Addendum)
You  May removed the bandages from your right chest wall on 08-10-17.  You may shower, but no bathing or submerging in water for at least 2 weeks.  Do not lift over 10-15lbs for at least 2 weeks to help with healing.  Dry gauze and tape over wound is sufficient once the original dressing is removed.  Discharge Instructions   You have a dressing with a plaster splint incorporated in it. Elevate your hand to reduce pain & swelling of the digits.  Ice over the operative site may be helpful to reduce pain & swelling.  DO NOT USE HEAT. Leave the dressing in place until you return to our office.  You may shower, but keep the bandage clean & dry.  You may drive a car when you are off of prescription pain medications and can safely control your vehicle with both hands. Our office will call you to arrange follow-up   Please call [phone] during normal business hours or [phone] after hours for any problems. Including the following:  - excessive redness of the incisions - drainage for more than 4 days - fever of more than 101.5 F  *Please note that pain medications will not be refilled after hours or on weekends.  Take the Keflex daily as prescribed   Follow up after surgical repair of chest wall laceration: - Keep dressing on for 72 hours after surgery (ie ok to remove on 08/11/17) - Ok to shower with wound open, but do not soak in a bath tub - Apply dry dressing daily and as needed if wound drains - We will remove sutures at your follow up appointment

## 2017-08-09 NOTE — Plan of Care (Signed)
  Progressing Education: Knowledge of General Education information will improve 08/09/2017 1011 - Progressing by Harlin Heys, RN Health Behavior/Discharge Planning: Ability to manage health-related needs will improve 08/09/2017 1011 - Progressing by Harlin Heys, RN Clinical Measurements: Ability to maintain clinical measurements within normal limits will improve 08/09/2017 1011 - Progressing by Harlin Heys, RN Will remain free from infection 08/09/2017 1011 - Progressing by Harlin Heys, RN Diagnostic test results will improve 08/09/2017 1011 - Progressing by Harlin Heys, RN Respiratory complications will improve 08/09/2017 1011 - Progressing by Harlin Heys, RN Cardiovascular complication will be avoided 08/09/2017 1011 - Progressing by Harlin Heys, RN Activity: Risk for activity intolerance will decrease 08/09/2017 1011 - Progressing by Harlin Heys, RN Nutrition: Adequate nutrition will be maintained 08/09/2017 1011 - Progressing by Harlin Heys, RN Coping: Level of anxiety will decrease 08/09/2017 1011 - Progressing by Harlin Heys, RN Elimination: Will not experience complications related to bowel motility 08/09/2017 1011 - Progressing by Harlin Heys, RN Will not experience complications related to urinary retention 08/09/2017 1011 - Progressing by Harlin Heys, RN Pain Managment: General experience of comfort will improve 08/09/2017 1011 - Progressing by Harlin Heys, RN Safety: Ability to remain free from injury will improve 08/09/2017 1011 - Progressing by Harlin Heys, RN Skin Integrity: Risk for impaired skin integrity will decrease 08/09/2017 1011 - Progressing by Harlin Heys, RN

## 2017-08-10 ENCOUNTER — Encounter: Payer: Self-pay | Admitting: Cardiology

## 2017-08-10 DIAGNOSIS — M79641 Pain in right hand: Secondary | ICD-10-CM | POA: Diagnosis not present
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
## 2017-08-13 ENCOUNTER — Encounter (HOSPITAL_BASED_OUTPATIENT_CLINIC_OR_DEPARTMENT_OTHER): Payer: Self-pay | Admitting: Certified Registered Nurse Anesthetist

## 2017-08-13 ENCOUNTER — Ambulatory Visit (HOSPITAL_BASED_OUTPATIENT_CLINIC_OR_DEPARTMENT_OTHER): Payer: No Typology Code available for payment source | Admitting: Anesthesiology

## 2017-08-13 ENCOUNTER — Encounter (HOSPITAL_BASED_OUTPATIENT_CLINIC_OR_DEPARTMENT_OTHER)
Admission: RE | Disposition: A | Discharge status: Home / Self Care | Payer: Self-pay | Source: Ambulatory Visit | Attending: Orthopedic Surgery

## 2017-08-13 ENCOUNTER — Ambulatory Visit (HOSPITAL_BASED_OUTPATIENT_CLINIC_OR_DEPARTMENT_OTHER)
Admission: RE | Admit: 2017-08-13 | Discharge: 2017-08-13 | Disposition: A | Discharge status: Home / Self Care | Payer: No Typology Code available for payment source | Source: Ambulatory Visit | Attending: Orthopedic Surgery | Admitting: Orthopedic Surgery

## 2017-08-13 ENCOUNTER — Ambulatory Visit (HOSPITAL_COMMUNITY): Payer: No Typology Code available for payment source

## 2017-08-13 DIAGNOSIS — S21119A Laceration without foreign body of unspecified front wall of thorax without penetration into thoracic cavity, initial encounter: Secondary | ICD-10-CM | POA: Insufficient documentation

## 2017-08-13 DIAGNOSIS — S63283A Dislocation of proximal interphalangeal joint of left middle finger, initial encounter: Secondary | ICD-10-CM | POA: Insufficient documentation

## 2017-08-13 DIAGNOSIS — S61213A Laceration without foreign body of left middle finger without damage to nail, initial encounter: Secondary | ICD-10-CM | POA: Diagnosis not present

## 2017-08-13 DIAGNOSIS — E785 Hyperlipidemia, unspecified: Secondary | ICD-10-CM | POA: Diagnosis not present

## 2017-08-13 DIAGNOSIS — Z419 Encounter for procedure for purposes other than remedying health state, unspecified: Secondary | ICD-10-CM

## 2017-08-13 DIAGNOSIS — I2510 Atherosclerotic heart disease of native coronary artery without angina pectoris: Secondary | ICD-10-CM | POA: Insufficient documentation

## 2017-08-13 DIAGNOSIS — S63253A Unspecified dislocation of left middle finger, initial encounter: Secondary | ICD-10-CM | POA: Diagnosis not present

## 2017-08-13 DIAGNOSIS — S2249XA Multiple fractures of ribs, unspecified side, initial encounter for closed fracture: Secondary | ICD-10-CM | POA: Diagnosis not present

## 2017-08-13 DIAGNOSIS — M67844 Other specified disorders of tendon, left hand: Secondary | ICD-10-CM | POA: Diagnosis not present

## 2017-08-13 DIAGNOSIS — I10 Essential (primary) hypertension: Secondary | ICD-10-CM | POA: Diagnosis not present

## 2017-08-13 HISTORY — PX: OPEN REDUCTION INTERNAL FIXATION (ORIF) DISTAL PHALANX: SHX6236

## 2017-08-13 HISTORY — PX: REPAIR EXTENSOR TENDON: SHX5382

## 2017-08-13 SURGERY — OPEN REDUCTION INTERNAL FIXATION (ORIF) DISTAL PHALANX
Anesthesia: General | Site: Finger | Laterality: Left

## 2017-08-13 MED ORDER — PROPOFOL 10 MG/ML IV BOLUS
INTRAVENOUS | Status: DC | PRN
  Administered 2017-08-13: 150 mg via INTRAVENOUS

## 2017-08-13 MED ORDER — BUPIVACAINE-EPINEPHRINE 0.5% -1:200000 IJ SOLN
INTRAMUSCULAR | Status: DC | PRN
  Administered 2017-08-13: 9 mL

## 2017-08-13 MED ORDER — DEXAMETHASONE SODIUM PHOSPHATE 10 MG/ML IJ SOLN
INTRAMUSCULAR | Status: DC | PRN
  Administered 2017-08-13: 10 mg via INTRAVENOUS

## 2017-08-13 MED ORDER — CEFAZOLIN SODIUM-DEXTROSE 2-4 GM/100ML-% IV SOLN
2.0000 g | INTRAVENOUS | Status: AC
  Administered 2017-08-13: 2 g via INTRAVENOUS

## 2017-08-13 MED ORDER — PHENYLEPHRINE HCL 10 MG/ML IJ SOLN
INTRAMUSCULAR | Status: DC | PRN
  Administered 2017-08-13 (×5): 80 ug via INTRAVENOUS

## 2017-08-13 MED ORDER — LIDOCAINE HCL (CARDIAC) 20 MG/ML IV SOLN
INTRAVENOUS | Status: AC
  Filled 2017-08-13: qty 5

## 2017-08-13 MED ORDER — EPHEDRINE 5 MG/ML INJ
INTRAVENOUS | Status: AC
Start: 2017-08-13 — End: ?
  Filled 2017-08-13: qty 10

## 2017-08-13 MED ORDER — ONDANSETRON HCL 4 MG/2ML IJ SOLN
INTRAMUSCULAR | Status: AC
  Filled 2017-08-13: qty 2

## 2017-08-13 MED ORDER — LACTATED RINGERS IV SOLN: INTRAVENOUS | Status: DC

## 2017-08-13 MED ORDER — PROPOFOL 10 MG/ML IV BOLUS
INTRAVENOUS | Status: AC
  Filled 2017-08-13: qty 20

## 2017-08-13 MED ORDER — LIDOCAINE HCL (CARDIAC) 20 MG/ML IV SOLN
INTRAVENOUS | Status: DC | PRN
  Administered 2017-08-13: 100 mg via INTRAVENOUS

## 2017-08-13 MED ORDER — FENTANYL CITRATE (PF) 100 MCG/2ML IJ SOLN
INTRAMUSCULAR | Status: DC | PRN
  Administered 2017-08-13 (×2): 25 ug via INTRAVENOUS
  Administered 2017-08-13: 50 ug via INTRAVENOUS

## 2017-08-13 MED ORDER — ONDANSETRON HCL 4 MG/2ML IJ SOLN: 4.0000 mg | Freq: Once | INTRAMUSCULAR | Status: DC | PRN

## 2017-08-13 MED ORDER — PHENYLEPHRINE 40 MCG/ML (10ML) SYRINGE FOR IV PUSH (FOR BLOOD PRESSURE SUPPORT)
PREFILLED_SYRINGE | INTRAVENOUS | Status: AC
  Filled 2017-08-13: qty 10

## 2017-08-13 MED ORDER — ONDANSETRON HCL 4 MG/2ML IJ SOLN
INTRAMUSCULAR | Status: DC | PRN
  Administered 2017-08-13: 4 mg via INTRAVENOUS

## 2017-08-13 MED ORDER — EPHEDRINE SULFATE 50 MG/ML IJ SOLN
INTRAMUSCULAR | Status: DC | PRN
  Administered 2017-08-13 (×2): 5 mg via INTRAVENOUS

## 2017-08-13 MED ORDER — DEXAMETHASONE SODIUM PHOSPHATE 10 MG/ML IJ SOLN
INTRAMUSCULAR | Status: AC
  Filled 2017-08-13: qty 1

## 2017-08-13 MED ORDER — FENTANYL CITRATE (PF) 100 MCG/2ML IJ SOLN: 25.0000 ug | INTRAMUSCULAR | Status: DC | PRN

## 2017-08-13 MED ORDER — IBUPROFEN 200 MG PO TABS: 600.0000 mg | ORAL_TABLET | Freq: Four times a day (QID) | ORAL | 0 refills | Status: DC

## 2017-08-13 MED ORDER — LACTATED RINGERS IV SOLN
INTRAVENOUS | Status: DC
  Administered 2017-08-13 (×2): via INTRAVENOUS

## 2017-08-13 MED ORDER — ACETAMINOPHEN 325 MG PO TABS: 650.0000 mg | ORAL_TABLET | Freq: Four times a day (QID) | ORAL | Status: AC

## 2017-08-13 MED ORDER — CEFAZOLIN SODIUM-DEXTROSE 2-4 GM/100ML-% IV SOLN
INTRAVENOUS | Status: AC
  Filled 2017-08-13: qty 100

## 2017-08-13 MED ORDER — FENTANYL CITRATE (PF) 100 MCG/2ML IJ SOLN: 50.0000 ug | INTRAMUSCULAR | Status: DC | PRN

## 2017-08-13 MED ORDER — OXYCODONE HCL 5 MG PO TABS: 5.0000 mg | ORAL_TABLET | Freq: Four times a day (QID) | ORAL | 0 refills | Status: DC | PRN

## 2017-08-13 MED ORDER — SCOPOLAMINE 1 MG/3DAYS TD PT72: 1.0000 | MEDICATED_PATCH | Freq: Once | TRANSDERMAL | Status: DC | PRN

## 2017-08-13 MED ORDER — FENTANYL CITRATE (PF) 100 MCG/2ML IJ SOLN
INTRAMUSCULAR | Status: AC
  Filled 2017-08-13: qty 2

## 2017-08-13 MED ORDER — MIDAZOLAM HCL 2 MG/2ML IJ SOLN: 1.0000 mg | INTRAMUSCULAR | Status: DC | PRN

## 2017-08-13 SURGICAL SUPPLY — 68 items
ANCH SUT 2-0 MN NDL DRL PLSTR (Anchor) ×2 IMPLANT
ANCHOR JUGGERKNOT 1.0 1DR 2-0 (Anchor) ×3 IMPLANT
BLADE MINI RND TIP GREEN BEAV (BLADE) IMPLANT
BLADE SURG 15 STRL LF DISP TIS (BLADE) ×2 IMPLANT
BLADE SURG 15 STRL SS (BLADE) ×4
BNDG CMPR 9X4 STRL LF SNTH (GAUZE/BANDAGES/DRESSINGS) ×2
BNDG COHESIVE 4X5 TAN STRL (GAUZE/BANDAGES/DRESSINGS) ×4 IMPLANT
BNDG ESMARK 4X9 LF (GAUZE/BANDAGES/DRESSINGS) ×4 IMPLANT
BNDG GAUZE ELAST 4 BULKY (GAUZE/BANDAGES/DRESSINGS) ×4 IMPLANT
CANISTER SUCTION 1200CC (MISCELLANEOUS) IMPLANT
CHLORAPREP W/TINT 26ML (MISCELLANEOUS) ×4 IMPLANT
CORD BIPOLAR FORCEPS 12FT (ELECTRODE) ×4 IMPLANT
COVER BACK TABLE 60X90IN (DRAPES) ×4 IMPLANT
COVER MAYO STAND STRL (DRAPES) ×4 IMPLANT
CUFF TOURNIQUET SINGLE 18IN (TOURNIQUET CUFF) ×3 IMPLANT
DECANTER SPIKE VIAL GLASS SM (MISCELLANEOUS) IMPLANT
DRAPE C-ARM 42X72 X-RAY (DRAPES) ×4 IMPLANT
DRAPE EXTREMITY T 121X128X90 (DRAPE) ×4 IMPLANT
DRAPE SURG 17X23 STRL (DRAPES) ×4 IMPLANT
DRSG EMULSION OIL 3X3 NADH (GAUZE/BANDAGES/DRESSINGS) ×4 IMPLANT
GAUZE SPONGE 4X4 12PLY STRL LF (GAUZE/BANDAGES/DRESSINGS) ×5 IMPLANT
GLOVE BIO SURGEON STRL SZ7.5 (GLOVE) ×4 IMPLANT
GLOVE BIOGEL PI IND STRL 7.0 (GLOVE) ×4 IMPLANT
GLOVE BIOGEL PI IND STRL 8 (GLOVE) ×2 IMPLANT
GLOVE BIOGEL PI INDICATOR 7.0 (GLOVE) ×6
GLOVE BIOGEL PI INDICATOR 8 (GLOVE) ×2
GLOVE ECLIPSE 6.5 STRL STRAW (GLOVE) ×7 IMPLANT
GOWN STRL REUS W/ TWL LRG LVL3 (GOWN DISPOSABLE) ×4 IMPLANT
GOWN STRL REUS W/TWL LRG LVL3 (GOWN DISPOSABLE) ×8
GOWN STRL REUS W/TWL XL LVL3 (GOWN DISPOSABLE) ×4 IMPLANT
LOOP VESSEL MAXI BLUE (MISCELLANEOUS) IMPLANT
LOOP VESSEL MINI RED (MISCELLANEOUS) IMPLANT
NDL HYPO 25X1 1.5 SAFETY (NEEDLE) IMPLANT
NEEDLE HYPO 25X1 1.5 SAFETY (NEEDLE) ×4 IMPLANT
NS IRRIG 1000ML POUR BTL (IV SOLUTION) ×4 IMPLANT
PACK BASIN DAY SURGERY FS (CUSTOM PROCEDURE TRAY) ×4 IMPLANT
PADDING CAST ABS 3INX4YD NS (CAST SUPPLIES)
PADDING CAST ABS 4INX4YD NS (CAST SUPPLIES) ×2
PADDING CAST ABS COTTON 3X4 (CAST SUPPLIES) IMPLANT
PADDING CAST ABS COTTON 4X4 ST (CAST SUPPLIES) ×2 IMPLANT
SLEEVE SCD COMPRESS KNEE MED (MISCELLANEOUS) ×4 IMPLANT
SLING ARM FOAM STRAP LRG (SOFTGOODS) IMPLANT
SPLINT PLASTER CAST XFAST 3X15 (CAST SUPPLIES) ×11 IMPLANT
SPLINT PLASTER CAST XFAST 4X15 (CAST SUPPLIES) IMPLANT
SPLINT PLASTER XTRA FAST SET 4 (CAST SUPPLIES)
SPLINT PLASTER XTRA FASTSET 3X (CAST SUPPLIES) ×20
STOCKINETTE 6  STRL (DRAPES) ×2
STOCKINETTE 6 STRL (DRAPES) ×2 IMPLANT
SUCTION FRAZIER HANDLE 10FR (MISCELLANEOUS)
SUCTION TUBE FRAZIER 10FR DISP (MISCELLANEOUS) IMPLANT
SUT ETHIBOND 3-0 V-5 (SUTURE) IMPLANT
SUT FIBERWIRE 2-0 18 17.9 3/8 (SUTURE)
SUT MERSILENE 4 0 P 3 (SUTURE) IMPLANT
SUT PROLENE 6 0 P 1 18 (SUTURE) ×4 IMPLANT
SUT SILK 4 0 PS 2 (SUTURE) IMPLANT
SUT STEEL 4 (SUTURE) IMPLANT
SUT SUPRAMID 3-0 (SUTURE) IMPLANT
SUT SUPRAMID 4-0 (SUTURE) IMPLANT
SUT VICRYL RAPIDE 4-0 (SUTURE) IMPLANT
SUT VICRYL RAPIDE 4/0 PS 2 (SUTURE) ×4 IMPLANT
SUTURE FIBERWR 2-0 18 17.9 3/8 (SUTURE) IMPLANT
SYR 10ML LL (SYRINGE) ×3 IMPLANT
SYR BULB 3OZ (MISCELLANEOUS) ×4 IMPLANT
TOWEL OR 17X24 6PK STRL BLUE (TOWEL DISPOSABLE) ×4 IMPLANT
TOWEL OR NON WOVEN STRL DISP B (DISPOSABLE) ×3 IMPLANT
TUBE CONNECTING 20'X1/4 (TUBING)
TUBE CONNECTING 20X1/4 (TUBING) IMPLANT
UNDERPAD 30X30 (UNDERPADS AND DIAPERS) ×4 IMPLANT

## 2017-08-13 NOTE — Anesthesia Procedure Notes (Signed)
Procedure Name: LMA Insertion Date/Time: 08/13/2017 9:41 AM Performed by: Raenette Rover, CRNA Pre-anesthesia Checklist: Patient identified, Emergency Drugs available, Suction available and Patient being monitored Patient Re-evaluated:Patient Re-evaluated prior to induction Oxygen Delivery Method: Circle system utilized Preoxygenation: Pre-oxygenation with 100% oxygen Induction Type: IV induction Ventilation: Mask ventilation without difficulty LMA: LMA inserted LMA Size: 4.0 Number of attempts: 1 Placement Confirmation: positive ETCO2,  CO2 detector and breath sounds checked- equal and bilateral Tube secured with: Tape Dental Injury: Teeth and Oropharynx as per pre-operative assessment

## 2017-08-13 NOTE — Anesthesia Preprocedure Evaluation (Addendum)
Anesthesia Evaluation  Patient identified by MRN, date of birth, ID band Patient awake    Reviewed: Allergy & Precautions, NPO status , Patient's Chart, lab work & pertinent test results, reviewed documented beta blocker date and time   Airway Mallampati: II  TM Distance: >3 FB Neck ROM: Full    Dental  (+) Teeth Intact, Dental Advisory Given   Pulmonary neg pulmonary ROS,    Pulmonary exam normal breath sounds clear to auscultation       Cardiovascular hypertension, Pt. on medications and Pt. on home beta blockers + CAD  Normal cardiovascular exam Rhythm:Regular Rate:Normal     Neuro/Psych negative neurological ROS     GI/Hepatic negative GI ROS, Neg liver ROS,   Endo/Other  negative endocrine ROS  Renal/GU negative Renal ROS     Musculoskeletal  (+) Arthritis , LEFT LONG FINGER OPEN DISLOCATION   Abdominal   Peds  Hematology  (+) Blood dyscrasia, anemia ,   Anesthesia Other Findings Day of surgery medications reviewed with the patient.  Reproductive/Obstetrics                            Anesthesia Physical Anesthesia Plan  ASA: II  Anesthesia Plan: General   Post-op Pain Management:    Induction: Intravenous  PONV Risk Score and Plan: 2 and Dexamethasone and Ondansetron  Airway Management Planned: LMA  Additional Equipment:   Intra-op Plan:   Post-operative Plan: Extubation in OR  Informed Consent: I have reviewed the patients History and Physical, chart, labs and discussed the procedure including the risks, benefits and alternatives for the proposed anesthesia with the patient or authorized representative who has indicated his/her understanding and acceptance.   Dental advisory given  Plan Discussed with: CRNA  Anesthesia Plan Comments:         Anesthesia Quick Evaluation

## 2017-08-13 NOTE — Transfer of Care (Signed)
Immediate Anesthesia Transfer of Care Note  Patient: Matthew Briggs  Procedure(s) Performed: OPEN TREATMENT OF LEFT LONG FINGER OPEN LOINTDISLOCATION OF PROXIMAL PHALANYX WITH EXTENSOR TENDON REPAIR (Left Finger) REPAIR EXTENSOR TENDON (Left Finger)  Patient Location: PACU  Anesthesia Type:General  Level of Consciousness: drowsy  Airway & Oxygen Therapy: Patient Spontanous Breathing and Patient connected to face mask oxygen  Post-op Assessment: Report given to RN and Post -op Vital signs reviewed and stable  Post vital signs: Reviewed and stable  Last Vitals:  Vitals:   08/13/17 0834 08/13/17 1028  BP: 112/62 (P) 104/61  Pulse: 62 62  Resp:  (!) 8  Temp: 36.4 C   SpO2: 100% 98%    Last Pain:  Vitals:   08/13/17 0834  TempSrc: Oral  PainSc: 7          Complications: No apparent anesthesia complications

## 2017-08-13 NOTE — Discharge Instructions (Signed)
°  Post Anesthesia Home Care Instructions  Activity: Get plenty of rest for the remainder of the day. A responsible individual must stay with you for 24 hours following the procedure.  For the next 24 hours, DO NOT: -Drive a car -Paediatric nurse -Drink alcoholic beverages -Take any medication unless instructed by your physician -Make any legal decisions or sign important papers.  Meals: Start with liquid foods such as gelatin or soup. Progress to regular foods as tolerated. Avoid greasy, spicy, heavy foods. If nausea and/or vomiting occur, drink only clear liquids until the nausea and/or vomiting subsides. Call your physician if vomiting continues.  Special Instructions/Symptoms: Your throat may feel dry or sore from the anesthesia or the breathing tube placed in your throat during surgery. If this causes discomfort, gargle with warm salt water. The discomfort should disappear within 24 hours.  If you had a scopolamine patch placed behind your ear for the management of post- operative nausea and/or vomiting:  1. The medication in the patch is effective for 72 hours, after which it should be removed.  Wrap patch in a tissue and discard in the trash. Wash hands thoroughly with soap and water. 2. You may remove the patch earlier than 72 hours if you experience unpleasant side effects which may include dry mouth, dizziness or visual disturbances. 3. Avoid touching the patch. Wash your hands with soap and water after contact with the patch.   Discharge Instructions   You have a dressing with a plaster splint incorporated in it. Move your fingers as much as possible, making a full fist and fully opening the fist. Elevate your hand to reduce pain & swelling of the digits.  Ice over the operative site may be helpful to reduce pain & swelling.  DO NOT USE HEAT. Pain medicine has been prescribed for you.  Take Ibuprofen 600 mg and Tylenol 650 mg together every 6 hours for pain. Take the  oxycodone 5 mg for severe breakthrough pain Leave the dressing in place until you return to our office.  You may shower, but keep the bandage clean & dry.  You may drive a car when you are off of prescription pain medications and can safely control your vehicle with both hands. Our office will contact you for a follow up appointment.   Please call [phone] during normal business hours or [phone] after hours for any problems. Including the following:  - excessive redness of the incisions - drainage for more than 4 days - fever of more than 101.5 F  *Please note that pain medications will not be refilled after hours or on weekends.  WORK STATUS: This patient may return to work on Monday 08/20/2017 with no lifting, gripping or grasping greater than pencil and paper tasks.

## 2017-08-13 NOTE — Interval H&P Note (Signed)
History and Physical Interval Note:  08/13/2017 8:26 AM  [place]  has presented today for surgery, with the diagnosis of LEFT LONG FINGER OPEN DISLOCATION [phone]  The various methods of treatment have been discussed with the patient and family. After consideration of risks, benefits and other options for treatment, the patient has consented to  Procedure(s): OPEN TREATMENT OF LEFT LONG FINGER OPEN DISLOCATION WITH EXTENSOR TENDON REPAIR (Left) REPAIR EXTENSOR TENDON (Left) as a surgical intervention .  The patient's history has been reviewed, patient examined, no change in status, stable for surgery.  I have reviewed the patient's chart and labs.  Questions were answered to the patient's satisfaction.     Jolyn Nap

## 2017-08-13 NOTE — Op Note (Signed)
08/13/2017  9:23 AM  PATIENT:  Matthew Briggs  72 y.o. male  PRE-OPERATIVE DIAGNOSIS:  L LF open PIP volar dislocation  POST-OPERATIVE DIAGNOSIS:  Same  PROCEDURE:   1. Open tx of L LF open PIP volar dislocation    2. L LF PIP central slip repair  SURGEON: Rayvon Char. Grandville Silos, MD  PHYSICIAN ASSISTANT: Morley Kos, OPA-C  ANESTHESIA:  general  SPECIMENS:  None  DRAINS:   None  EBL:  less than 50 mL  PREOPERATIVE INDICATIONS:  Matthew Briggs is a  72 y.o. male who sustained, amongst other injuries, and open volar PIP dislocation of the left long finger.  It was provisionally reduced, irrigated, and the skin closed.  He presents today for more definitive management, including likely repair of the central slip and assessment of stability throughout ROM.  The risks benefits and alternatives were discussed with the patient preoperatively including but not limited to the risks of infection, bleeding, nerve injury, cardiopulmonary complications, the need for revision surgery, among others, and the patient verbalized understanding and consented to proceed.  OPERATIVE IMPLANTS: Mini-juggerknot x 1  OPERATIVE PROCEDURE:  After receiving prophylactic antibiotics, the patient was escorted to the operative theatre and placed in a supine position.  A surgical "time-out" was performed during which the planned procedure, proposed operative site, and the correct patient identity were compared to the operative consent and agreement confirmed by the circulating nurse according to current facility policy.  Following application of a tourniquet to the operative extremity, the exposed skin was prepped with Chloraprep and draped in the usual sterile fashion.  The limb was exsanguinated with an Esmarch bandage and the tourniquet inflated to approximately 142mmHg higher than systolic BP.  Sutures are removed from the dorsal aspects of both the long and ring fingers.  The open wound on the long  finger was extended proximally and distally, and to a 3 limb zigzag incision that provided for exposure to the dorsum of the PIP joint.  The skin flap edges were excisionally debrided.  There was a breach in the tendon, and the joint could be inspected.  There was chondral scuffing on both sides.  In addition, some of the dorsal rim fragments from the base of the middle phalanx were loose, comminuted, and adherent to the dorsal capsule and extensor tendon proximally.  A couple of the small fragments were excised.  The collateral ligaments appear to be intact.  The joint was irrigated copiously.  The joint was inspected fluoroscopically, found to be stable throughout its range of motion.  A mini juggernaut suture anchor was placed into the middle phalanx and used to repair both the dorsal capsule and the extensor tendon to the middle phalanx in the extended position.  Capsule both radially and ulnarly of the central tendon was also repaired with leftover 2 oh max braid suture.  The joint was in extension and stable, and in fact difficult without some pressure to flex past 90.  Final images were obtained.  The wound was again irrigated and skin closed with 4-0 Vicryl Rapide interrupted sutures, both through the long and ring fingers.  On the long finger, both the traumatic wound and the surgically created incisions were closed with the same suture material.  A digital block of the left long finger was performed with half percent Marcaine and epinephrine.  A short arm splint dressing was applied with a volar plaster component, and placed a long, ring, and small fingers in extension allowed the  thumb and index finger to be free.  He was awakened taken to the recovery room in stable condition, breathing spontaneously.  DISPOSITION: He will be discharged home today, returning in a week to have the splint dressing removed, and a follow-on hand therapy appointment made to make a digital resting extension splint and begin  active range of motion exercises.

## 2017-08-13 NOTE — Anesthesia Postprocedure Evaluation (Signed)
Anesthesia Post Note  Patient: Matthew Briggs  Procedure(s) Performed: OPEN TREATMENT OF LEFT LONG FINGER OPEN LOINTDISLOCATION OF PROXIMAL PHALANYX WITH EXTENSOR TENDON REPAIR (Left Finger) REPAIR EXTENSOR TENDON (Left Finger)     Patient location during evaluation: PACU Anesthesia Type: General Level of consciousness: awake and alert Pain management: pain level controlled Vital Signs Assessment: post-procedure vital signs reviewed and stable Respiratory status: spontaneous breathing, nonlabored ventilation and respiratory function stable Cardiovascular status: blood pressure returned to baseline and stable Postop Assessment: no apparent nausea or vomiting Anesthetic complications: no    Last Vitals:  Vitals:   08/13/17 1110 08/13/17 1145  BP:  118/89  Pulse: 72 72  Resp: 14 16  Temp:  36.6 C  SpO2: 92% 95%    Last Pain:  Vitals:   08/13/17 0834  TempSrc: Oral  PainSc: 7                  Catalina Gravel

## 2017-08-14 ENCOUNTER — Encounter (HOSPITAL_BASED_OUTPATIENT_CLINIC_OR_DEPARTMENT_OTHER): Payer: Self-pay | Admitting: Orthopedic Surgery

## 2017-08-16 ENCOUNTER — Other Ambulatory Visit (HOSPITAL_COMMUNITY): Payer: Self-pay | Admitting: Family Medicine

## 2017-08-16 ENCOUNTER — Encounter (HOSPITAL_BASED_OUTPATIENT_CLINIC_OR_DEPARTMENT_OTHER): Payer: Self-pay | Admitting: Orthopedic Surgery

## 2017-08-16 DIAGNOSIS — I739 Peripheral vascular disease, unspecified: Secondary | ICD-10-CM

## 2017-08-17 ENCOUNTER — Other Ambulatory Visit: Payer: Self-pay

## 2017-08-20 ENCOUNTER — Other Ambulatory Visit: Payer: Self-pay

## 2017-08-20 MED ORDER — AMLODIPINE BESYLATE 2.5 MG PO TABS: 2.5000 mg | ORAL_TABLET | Freq: Every day | ORAL | 5 refills | Status: DC

## 2017-08-21 DIAGNOSIS — M1812 Unilateral primary osteoarthritis of first carpometacarpal joint, left hand: Secondary | ICD-10-CM | POA: Diagnosis not present

## 2017-08-21 DIAGNOSIS — S63283D Dislocation of proximal interphalangeal joint of left middle finger, subsequent encounter: Secondary | ICD-10-CM | POA: Diagnosis not present

## 2017-08-21 DIAGNOSIS — S63253A Unspecified dislocation of left middle finger, initial encounter: Secondary | ICD-10-CM | POA: Diagnosis not present

## 2017-08-21 DIAGNOSIS — M1811 Unilateral primary osteoarthritis of first carpometacarpal joint, right hand: Secondary | ICD-10-CM | POA: Diagnosis not present

## 2017-08-21 DIAGNOSIS — S56494D Other injury of extensor muscle, fascia and tendon of left middle finger at forearm level, subsequent encounter: Secondary | ICD-10-CM | POA: Diagnosis not present

## 2017-08-22 ENCOUNTER — Ambulatory Visit (HOSPITAL_COMMUNITY)
Admission: RE | Admit: 2017-08-22 | Discharge: 2017-08-22 | Disposition: A | Discharge status: Home / Self Care | Payer: PPO | Source: Ambulatory Visit | Attending: Family Medicine | Admitting: Family Medicine

## 2017-08-22 DIAGNOSIS — I739 Peripheral vascular disease, unspecified: Secondary | ICD-10-CM | POA: Diagnosis not present

## 2017-08-22 NOTE — Progress Notes (Signed)
Bilateral ABIs completed. ABIs and Doppler waveforms indicate normal areial flow bilaterally at rest. Toma Copier, RVS  08/22/2017,10:09 AM

## 2017-08-23 ENCOUNTER — Encounter (HOSPITAL_COMMUNITY): Payer: PPO

## 2017-08-28 ENCOUNTER — Other Ambulatory Visit (HOSPITAL_COMMUNITY)
Admission: RE | Admit: 2017-08-28 | Discharge: 2017-08-28 | Disposition: A | Discharge status: Home / Self Care | Payer: PPO | Source: Ambulatory Visit | Attending: Cardiology | Admitting: Cardiology

## 2017-08-28 DIAGNOSIS — R9439 Abnormal result of other cardiovascular function study: Secondary | ICD-10-CM | POA: Insufficient documentation

## 2017-08-28 DIAGNOSIS — Z79899 Other long term (current) drug therapy: Secondary | ICD-10-CM | POA: Diagnosis not present

## 2017-08-28 DIAGNOSIS — R931 Abnormal findings on diagnostic imaging of heart and coronary circulation: Secondary | ICD-10-CM | POA: Insufficient documentation

## 2017-08-28 LAB — BASIC METABOLIC PANEL
ANION GAP: 9 (ref 5–15)
BUN: 22 mg/dL — ABNORMAL HIGH (ref 6–20)
CALCIUM: 9.3 mg/dL (ref 8.9–10.3)
CHLORIDE: 98 mmol/L — AB (ref 101–111)
CO2: 26 mmol/L (ref 22–32)
Creatinine, Ser: 1.1 mg/dL (ref 0.61–1.24)
GFR calc non Af Amer: >60 mL/min (ref >60)
Glucose, Bld: 100 mg/dL — ABNORMAL HIGH (ref 65–99)
Potassium: 3.9 mmol/L (ref 3.5–5.1)
Sodium: 133 mmol/L — ABNORMAL LOW (ref 135–145)

## 2017-08-29 ENCOUNTER — Ambulatory Visit (HOSPITAL_COMMUNITY)
Admission: RE | Admit: 2017-08-29 | Discharge: 2017-08-29 | Disposition: A | Discharge status: Home / Self Care | Payer: PPO | Source: Ambulatory Visit | Attending: Cardiology | Admitting: Cardiology

## 2017-08-29 ENCOUNTER — Telehealth: Payer: Self-pay | Admitting: Cardiology

## 2017-08-29 DIAGNOSIS — R931 Abnormal findings on diagnostic imaging of heart and coronary circulation: Secondary | ICD-10-CM | POA: Diagnosis present

## 2017-08-29 DIAGNOSIS — R911 Solitary pulmonary nodule: Secondary | ICD-10-CM | POA: Insufficient documentation

## 2017-08-29 DIAGNOSIS — R9439 Abnormal result of other cardiovascular function study: Secondary | ICD-10-CM

## 2017-08-29 DIAGNOSIS — I700 Atherosclerosis of aorta: Secondary | ICD-10-CM | POA: Insufficient documentation

## 2017-08-29 DIAGNOSIS — R079 Chest pain, unspecified: Secondary | ICD-10-CM | POA: Diagnosis not present

## 2017-08-29 MED ORDER — NITROGLYCERIN 0.4 MG SL SUBL: 0.8000 mg | SUBLINGUAL_TABLET | SUBLINGUAL | Status: DC | PRN

## 2017-08-29 MED ORDER — IOPAMIDOL (ISOVUE-370) INJECTION 76%
INTRAVENOUS | Status: AC
  Administered 2017-08-29: 80 mL via INTRAVENOUS
  Filled 2017-08-29: qty 100

## 2017-08-29 MED ORDER — NITROGLYCERIN 0.4 MG SL SUBL
SUBLINGUAL_TABLET | SUBLINGUAL | Status: AC
  Filled 2017-08-29: qty 2

## 2017-08-29 MED ORDER — NITROGLYCERIN 0.4 MG SL SUBL: 0.4000 mg | SUBLINGUAL_TABLET | SUBLINGUAL | Status: DC | PRN

## 2017-08-29 NOTE — Telephone Encounter (Signed)
Called report from radiology  The impression showed a right pulmonary nodule - see full report.   RN sees report ( radiology overread) and will route to Dr Percival Spanish for further instuctions

## 2017-08-29 NOTE — Telephone Encounter (Signed)
New message:    Opal Sidles calling from [facility] to give a report of pt of [place]

## 2017-08-30 DIAGNOSIS — S63283D Dislocation of proximal interphalangeal joint of left middle finger, subsequent encounter: Secondary | ICD-10-CM | POA: Diagnosis not present

## 2017-08-30 DIAGNOSIS — S56494D Other injury of extensor muscle, fascia and tendon of left middle finger at forearm level, subsequent encounter: Secondary | ICD-10-CM | POA: Diagnosis not present

## 2017-09-04 DIAGNOSIS — S56494D Other injury of extensor muscle, fascia and tendon of left middle finger at forearm level, subsequent encounter: Secondary | ICD-10-CM | POA: Diagnosis not present

## 2017-09-04 DIAGNOSIS — S63283D Dislocation of proximal interphalangeal joint of left middle finger, subsequent encounter: Secondary | ICD-10-CM | POA: Diagnosis not present

## 2017-09-04 NOTE — Telephone Encounter (Signed)
Report in Dr Warren Lacy basket to be read

## 2017-09-06 ENCOUNTER — Telehealth: Payer: Self-pay | Admitting: Cardiology

## 2017-09-06 NOTE — Telephone Encounter (Signed)
New message    Patient wife Gwinda Passe returning call , did not understand the message that was left

## 2017-09-06 NOTE — Telephone Encounter (Signed)
Routed to Dr. Rosezella Florida primary nurse

## 2017-09-07 DIAGNOSIS — S63283D Dislocation of proximal interphalangeal joint of left middle finger, subsequent encounter: Secondary | ICD-10-CM | POA: Diagnosis not present

## 2017-09-07 DIAGNOSIS — S56494D Other injury of extensor muscle, fascia and tendon of left middle finger at forearm level, subsequent encounter: Secondary | ICD-10-CM | POA: Diagnosis not present

## 2017-09-07 NOTE — Telephone Encounter (Signed)
Spoke with pt and his wife.

## 2017-09-11 DIAGNOSIS — S63283D Dislocation of proximal interphalangeal joint of left middle finger, subsequent encounter: Secondary | ICD-10-CM | POA: Diagnosis not present

## 2017-09-11 DIAGNOSIS — S56494D Other injury of extensor muscle, fascia and tendon of left middle finger at forearm level, subsequent encounter: Secondary | ICD-10-CM | POA: Diagnosis not present

## 2017-09-17 DIAGNOSIS — S63283D Dislocation of proximal interphalangeal joint of left middle finger, subsequent encounter: Secondary | ICD-10-CM | POA: Diagnosis not present

## 2017-09-17 DIAGNOSIS — S56494D Other injury of extensor muscle, fascia and tendon of left middle finger at forearm level, subsequent encounter: Secondary | ICD-10-CM | POA: Diagnosis not present

## 2017-09-19 DIAGNOSIS — S63283D Dislocation of proximal interphalangeal joint of left middle finger, subsequent encounter: Secondary | ICD-10-CM | POA: Diagnosis not present

## 2017-09-19 DIAGNOSIS — S56494D Other injury of extensor muscle, fascia and tendon of left middle finger at forearm level, subsequent encounter: Secondary | ICD-10-CM | POA: Diagnosis not present

## 2017-09-24 DIAGNOSIS — M79661 Pain in right lower leg: Secondary | ICD-10-CM | POA: Diagnosis not present

## 2017-09-26 ENCOUNTER — Telehealth: Payer: Self-pay | Admitting: Cardiology

## 2017-09-26 NOTE — Telephone Encounter (Signed)
New Message:    Pt wants you to ask Dr Percival Spanish if there any where he can be doubled book or put somewhere before Wandra Mannan needs to be out of town on 10-05-17.She said she talked to Dr Percival Spanish 's scheduler and she told him she would check with Dr Percival Spanish to see if she could do this.

## 2017-09-27 DIAGNOSIS — S63253A Unspecified dislocation of left middle finger, initial encounter: Secondary | ICD-10-CM | POA: Diagnosis not present

## 2017-09-28 NOTE — Telephone Encounter (Signed)
LM for wife that patient has been scheduled for 10/05/17. Advised to call back if further assistance needed.

## 2017-09-28 NOTE — Telephone Encounter (Signed)
Patient's wife called back in. She states that [birth year] is her birthday and they need to be [place] for a wedding this weekend. They need to leave town by 10am. Moved patient's 5/10 3pm appt to 7:40am on this same day to review CTA results

## 2017-09-28 NOTE — Telephone Encounter (Signed)
New message ° °Pt wife verbalized that she is returning call for RN °

## 2017-10-01 DIAGNOSIS — S56494D Other injury of extensor muscle, fascia and tendon of left middle finger at forearm level, subsequent encounter: Secondary | ICD-10-CM | POA: Diagnosis not present

## 2017-10-01 DIAGNOSIS — S63283D Dislocation of proximal interphalangeal joint of left middle finger, subsequent encounter: Secondary | ICD-10-CM | POA: Diagnosis not present

## 2017-10-02 DIAGNOSIS — S63283D Dislocation of proximal interphalangeal joint of left middle finger, subsequent encounter: Secondary | ICD-10-CM | POA: Diagnosis not present

## 2017-10-02 DIAGNOSIS — M7662 Achilles tendinitis, left leg: Secondary | ICD-10-CM | POA: Diagnosis not present

## 2017-10-02 DIAGNOSIS — S56494D Other injury of extensor muscle, fascia and tendon of left middle finger at forearm level, subsequent encounter: Secondary | ICD-10-CM | POA: Diagnosis not present

## 2017-10-04 DIAGNOSIS — L570 Actinic keratosis: Secondary | ICD-10-CM | POA: Diagnosis not present

## 2017-10-04 DIAGNOSIS — Z85828 Personal history of other malignant neoplasm of skin: Secondary | ICD-10-CM | POA: Diagnosis not present

## 2017-10-04 DIAGNOSIS — L821 Other seborrheic keratosis: Secondary | ICD-10-CM | POA: Diagnosis not present
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
## 2017-10-05 ENCOUNTER — Ambulatory Visit: Payer: PPO | Admitting: Cardiology

## 2017-10-05 ENCOUNTER — Encounter: Payer: Self-pay | Admitting: Cardiology

## 2017-10-05 ENCOUNTER — Ambulatory Visit (INDEPENDENT_AMBULATORY_CARE_PROVIDER_SITE_OTHER): Payer: PPO | Admitting: Cardiology

## 2017-10-05 VITALS — BP 162/72 | HR 63 | Ht 69.0 in | Wt 185.6 lb

## 2017-10-05 DIAGNOSIS — Z9861 Coronary angioplasty status: Secondary | ICD-10-CM

## 2017-10-05 DIAGNOSIS — Z79899 Other long term (current) drug therapy: Secondary | ICD-10-CM | POA: Diagnosis not present

## 2017-10-05 DIAGNOSIS — R5383 Other fatigue: Secondary | ICD-10-CM

## 2017-10-05 DIAGNOSIS — I2510 Atherosclerotic heart disease of native coronary artery without angina pectoris: Secondary | ICD-10-CM | POA: Insufficient documentation

## 2017-10-05 DIAGNOSIS — R9389 Abnormal findings on diagnostic imaging of other specified body structures: Secondary | ICD-10-CM

## 2017-10-05 DIAGNOSIS — I493 Ventricular premature depolarization: Secondary | ICD-10-CM | POA: Diagnosis not present

## 2017-10-05 DIAGNOSIS — I25119 Atherosclerotic heart disease of native coronary artery with unspecified angina pectoris: Secondary | ICD-10-CM | POA: Diagnosis not present

## 2017-10-05 HISTORY — DX: Abnormal findings on diagnostic imaging of other specified body structures: R93.89

## 2017-10-05 NOTE — Patient Instructions (Signed)
Medication Instructions:  Continue current medications  If you need a refill on your cardiac medications before your next appointment, please call your pharmacy.  Labwork: Pre Op Labs HERE IN OUR OFFICE AT [facility]  Take the provided lab slips with you to the lab for your blood draw.   You will NOT need to fast   Testing/Procedures: Your physician has requested that you have a left heart cardiac catheterization. Cardiac catheterization is used to diagnose and/or treat various heart conditions. Doctors may recommend this procedure for a number of different reasons. The most common reason is to evaluate chest pain. Chest pain can be a symptom of coronary artery disease (CAD), and cardiac catheterization can show whether plaque is narrowing or blocking your heart's arteries. This procedure is also used to evaluate the valves, as well as measure the blood flow and oxygen levels in different parts of your heart. For further information please visit [email or web address]. Please follow instruction sheet, as given.  Special Instructions:   [facility] [address] [place] [address] Dept: [phone] Loc: [place]  10/05/2017  You are scheduled for a Cardiac Catheterization on Monday, May 20 with Dr. Glenetta Hew.  1. Please arrive at the [facility] (Main Entrance A) at [facility]: [address] [place], [place] [address] at 5:30 AM (two hours before your procedure to ensure your preparation). Free valet parking service is available.   Special note: Every effort is made to have your procedure done on time. Please understand that emergencies sometimes delay scheduled procedures.  2. Diet: Do not eat or drink anything after midnight prior to your procedure except sips of water to take medications.  3. Labs: Monday  4. Medication instructions in preparation for your  procedure:    Current Outpatient Medications (Cardiovascular):  .  amLODipine (NORVASC) 2.5 MG tablet, Take 1 tablet (2.5 mg total) by mouth daily. .  CRESTOR 5 MG tablet, Take 1 tablet by mouth daily. .  metoprolol tartrate (LOPRESSOR) 50 MG tablet, Take 1 tablet (50 mg total) by mouth 2 (two) times daily. .  rosuvastatin (CRESTOR) 10 MG tablet, Take 10 mg by mouth daily at 6 PM. .  valsartan-hydrochlorothiazide (DIOVAN-HCT) 320-25 MG per tablet, Take 1 tablet by mouth daily.   Current Outpatient Medications (Analgesics):  .  acetaminophen (TYLENOL) 325 MG tablet, Take 2 tablets (650 mg total) by mouth every 6 (six) hours. Marland Kitchen  aspirin 81 MG tablet, Take 81 mg by mouth daily. Marland Kitchen  ibuprofen (ADVIL) 200 MG tablet, Take 3 tablets (600 mg total) by mouth every 6 (six) hours. .  traMADol (ULTRAM) 50 MG tablet, Take 1 tablet (50 mg total) by mouth every 6 (six) hours as needed (mild pain).   Current Outpatient Medications (Other):  Marland Kitchen  Calcium Carb-Cholecalciferol (CALCIUM-VITAMIN D) 500-200 MG-UNIT tablet, Take 1 tablet by mouth daily. .  Calcium Carbonate-Vitamin D (CALCIUM 500 + D PO), Take 1 capsule by mouth daily. .  cholecalciferol (VITAMIN D) 1000 UNITS tablet, Take 1,000 Units by mouth daily. .  Coenzyme Q10 (CO Q 10) 100 MG CAPS, Take 1 capsule by mouth daily. Marland Kitchen  docusate sodium (COLACE) 100 MG capsule, Take 1 capsule (100 mg total) by mouth 2 (two) times daily. .  magnesium oxide (MAG-OX) 400 MG tablet, Take 400 mg by mouth daily. .  methocarbamol (ROBAXIN) 500 MG tablet, Take 1 tablet (500 mg total) by mouth every 8 (eight)  hours as needed for muscle spasms. .  Multiple Vitamin (MULTIVITAMIN WITH MINERALS) TABS tablet, Take 1 tablet by mouth daily. .  NON FORMULARY,  .  Omega 3 1000 MG CAPS, Take 1 capsule by mouth daily. Marland Kitchen  POTASSIUM CHLORIDE PO, Take 1 capsule by mouth daily. .  quiNINE (QUALAQUIN) 324 MG capsule, Take 648 mg by mouth daily. *For reference purposes while preparing  patient instructions.   Delete this med list prior to printing instructions for patient.*   On the morning of your procedure, take your Aspirin and any morning medicines NOT listed above.  You may use sips of water.  5. Plan for one night stay--bring personal belongings. 6. Bring a current list of your medications and current insurance cards. 7. You MUST have a responsible person to drive you home. 8. Someone MUST be with you the first 24 hours after you arrive home or your discharge will be delayed. 9. Please wear clothes that are easy to get on and off and wear slip-on shoes.  Thank you for allowing [place] to care for you!   -- [facility]   Follow-Up: Your physician wants you to follow-up in: After Cath.     Thank you for choosing [facility] at [facility]!!

## 2017-10-08 DIAGNOSIS — D689 Coagulation defect, unspecified: Secondary | ICD-10-CM | POA: Diagnosis not present

## 2017-10-08 DIAGNOSIS — Z79899 Other long term (current) drug therapy: Secondary | ICD-10-CM | POA: Diagnosis not present

## 2017-10-08 DIAGNOSIS — R5383 Other fatigue: Secondary | ICD-10-CM | POA: Diagnosis not present

## 2017-10-08 DIAGNOSIS — R9389 Abnormal findings on diagnostic imaging of other specified body structures: Secondary | ICD-10-CM | POA: Diagnosis not present

## 2017-10-09 LAB — BASIC METABOLIC PANEL
BUN / CREAT RATIO: 20 (ref 10–24)
BUN: 16 mg/dL (ref 8–27)
CHLORIDE: 98 mmol/L (ref 96–106)
CO2: 22 mmol/L (ref 20–29)
Calcium: 9.2 mg/dL (ref 8.6–10.2)
Creatinine, Ser: 0.82 mg/dL (ref 0.76–1.27)
GFR calc Af Amer: 104 mL/min/{1.73_m2} (ref >59)
GFR calc non Af Amer: 90 mL/min/{1.73_m2} (ref >59)
GLUCOSE: 163 mg/dL — AB (ref 65–99)
Potassium: 3.9 mmol/L (ref 3.5–5.2)
SODIUM: 137 mmol/L (ref 134–144)

## 2017-10-09 LAB — CBC
Hematocrit: 41.1 % (ref 37.5–51.0)
Hemoglobin: 13.9 g/dL (ref 13.0–17.7)
MCH: 30.4 pg (ref 26.6–33.0)
MCHC: 33.8 g/dL (ref 31.5–35.7)
MCV: 90 fL (ref 79–97)
PLATELETS: 165 10*3/uL (ref 150–379)
RBC: 4.57 x10E6/uL (ref 4.14–5.80)
RDW: 13.7 % (ref 12.3–15.4)
WBC: 5.5 10*3/uL (ref 3.4–10.8)

## 2017-10-09 LAB — PROTIME-INR
INR: 1 (ref 0.8–1.2)
Prothrombin Time: 10.4 s (ref 9.1–12.0)

## 2017-10-09 LAB — TSH: TSH: 3.02 u[IU]/mL (ref 0.450–4.500)

## 2017-10-09 LAB — APTT: aPTT: 28 s (ref 24–33)

## 2017-10-11 ENCOUNTER — Telehealth: Payer: Self-pay | Admitting: *Deleted

## 2017-10-11 NOTE — Telephone Encounter (Signed)
Pt contacted pre-catheterization scheduled at [facility] for: Monday Oct 15, 2017 7:30 AM Verified arrival time and place: [place] Entrance A at: 5:30 AM  No solid food after midnight prior to cath, clear liquids until 5 AM day of procedure. Verified allergies in Epic Verified no diabetes medications.  Hold: Valsartan-HCTZ AM of procedure.  AM meds can be  taken pre-cath with sip of water including: ASA 81 mg  Confirmed patient has responsible person to drive home post procedure and observe patient for 24 hours: yes

## 2017-10-12 DIAGNOSIS — S63283D Dislocation of proximal interphalangeal joint of left middle finger, subsequent encounter: Secondary | ICD-10-CM | POA: Diagnosis not present

## 2017-10-12 DIAGNOSIS — S56494D Other injury of extensor muscle, fascia and tendon of left middle finger at forearm level, subsequent encounter: Secondary | ICD-10-CM | POA: Diagnosis not present

## 2017-10-15 ENCOUNTER — Other Ambulatory Visit: Payer: Self-pay

## 2017-10-15 ENCOUNTER — Ambulatory Visit (HOSPITAL_COMMUNITY)
Admission: RE | Disposition: A | Discharge status: Home / Self Care | Payer: Self-pay | Source: Ambulatory Visit | Attending: Cardiology

## 2017-10-15 ENCOUNTER — Ambulatory Visit (HOSPITAL_COMMUNITY)
Admission: RE | Admit: 2017-10-15 | Discharge: 2017-10-16 | Disposition: A | Discharge status: Home / Self Care | Payer: PPO | Source: Ambulatory Visit | Attending: Cardiology | Admitting: Cardiology

## 2017-10-15 ENCOUNTER — Encounter (HOSPITAL_COMMUNITY): Payer: Self-pay | Admitting: Cardiology

## 2017-10-15 DIAGNOSIS — R9389 Abnormal findings on diagnostic imaging of other specified body structures: Secondary | ICD-10-CM | POA: Diagnosis present

## 2017-10-15 DIAGNOSIS — Z7982 Long term (current) use of aspirin: Secondary | ICD-10-CM | POA: Diagnosis not present

## 2017-10-15 DIAGNOSIS — I10 Essential (primary) hypertension: Secondary | ICD-10-CM | POA: Diagnosis not present

## 2017-10-15 DIAGNOSIS — I2510 Atherosclerotic heart disease of native coronary artery without angina pectoris: Secondary | ICD-10-CM

## 2017-10-15 DIAGNOSIS — I2584 Coronary atherosclerosis due to calcified coronary lesion: Secondary | ICD-10-CM | POA: Diagnosis not present

## 2017-10-15 DIAGNOSIS — R911 Solitary pulmonary nodule: Secondary | ICD-10-CM | POA: Diagnosis not present

## 2017-10-15 DIAGNOSIS — Z9861 Coronary angioplasty status: Secondary | ICD-10-CM

## 2017-10-15 DIAGNOSIS — E785 Hyperlipidemia, unspecified: Secondary | ICD-10-CM | POA: Insufficient documentation

## 2017-10-15 DIAGNOSIS — I2582 Chronic total occlusion of coronary artery: Secondary | ICD-10-CM | POA: Insufficient documentation

## 2017-10-15 DIAGNOSIS — I25119 Atherosclerotic heart disease of native coronary artery with unspecified angina pectoris: Secondary | ICD-10-CM

## 2017-10-15 DIAGNOSIS — Z955 Presence of coronary angioplasty implant and graft: Secondary | ICD-10-CM

## 2017-10-15 DIAGNOSIS — I493 Ventricular premature depolarization: Secondary | ICD-10-CM | POA: Diagnosis not present

## 2017-10-15 HISTORY — DX: Atherosclerotic heart disease of native coronary artery without angina pectoris: I25.10

## 2017-10-15 HISTORY — PX: LEFT HEART CATH AND CORONARY ANGIOGRAPHY: CATH118249

## 2017-10-15 HISTORY — PX: CORONARY STENT INTERVENTION: CATH118234

## 2017-10-15 LAB — POCT ACTIVATED CLOTTING TIME
Activated Clotting Time: 235 seconds
Activated Clotting Time: 279 seconds
Activated Clotting Time: 351 seconds

## 2017-10-15 SURGERY — LEFT HEART CATH AND CORONARY ANGIOGRAPHY
Anesthesia: LOCAL

## 2017-10-15 MED ORDER — SODIUM CHLORIDE 0.9 % WEIGHT BASED INFUSION
3.0000 mL/kg/h | INTRAVENOUS | Status: DC
  Administered 2017-10-15: 3 mL/kg/h via INTRAVENOUS

## 2017-10-15 MED ORDER — SODIUM CHLORIDE 0.9% FLUSH
3.0000 mL | Freq: Two times a day (BID) | INTRAVENOUS | Status: DC
  Administered 2017-10-15 (×2): 3 mL via INTRAVENOUS

## 2017-10-15 MED ORDER — HEPARIN SODIUM (PORCINE) 1000 UNIT/ML IJ SOLN
INTRAMUSCULAR | Status: AC
  Filled 2017-10-15: qty 1

## 2017-10-15 MED ORDER — MIDAZOLAM HCL 2 MG/2ML IJ SOLN
INTRAMUSCULAR | Status: DC | PRN
  Administered 2017-10-15: 2 mg via INTRAVENOUS

## 2017-10-15 MED ORDER — VERAPAMIL HCL 2.5 MG/ML IV SOLN
INTRAVENOUS | Status: DC | PRN
  Administered 2017-10-15: 10 mL via INTRA_ARTERIAL

## 2017-10-15 MED ORDER — SODIUM CHLORIDE 0.9% FLUSH: 3.0000 mL | INTRAVENOUS | Status: DC | PRN

## 2017-10-15 MED ORDER — SODIUM CHLORIDE 0.9% FLUSH: 3.0000 mL | Freq: Two times a day (BID) | INTRAVENOUS | Status: DC

## 2017-10-15 MED ORDER — CLOPIDOGREL BISULFATE 300 MG PO TABS
ORAL_TABLET | ORAL | Status: AC
  Filled 2017-10-15: qty 1

## 2017-10-15 MED ORDER — LABETALOL HCL 5 MG/ML IV SOLN: 10.0000 mg | INTRAVENOUS | Status: AC | PRN

## 2017-10-15 MED ORDER — CLOPIDOGREL BISULFATE 300 MG PO TABS
ORAL_TABLET | ORAL | Status: DC | PRN
  Administered 2017-10-15: 600 mg via ORAL

## 2017-10-15 MED ORDER — SODIUM CHLORIDE 0.9 % IV SOLN
INTRAVENOUS | Status: AC
  Administered 2017-10-15: 10:00:00 via INTRAVENOUS

## 2017-10-15 MED ORDER — HEPARIN (PORCINE) IN NACL 2-0.9 UNITS/ML
INTRAMUSCULAR | Status: DC | PRN
  Administered 2017-10-15 (×2): 500 mL via INTRA_ARTERIAL

## 2017-10-15 MED ORDER — ONDANSETRON HCL 4 MG/2ML IJ SOLN: 4.0000 mg | Freq: Four times a day (QID) | INTRAMUSCULAR | Status: DC | PRN

## 2017-10-15 MED ORDER — ANGIOPLASTY BOOK
Freq: Once | Status: AC
  Administered 2017-10-15: 18:00:00
  Filled 2017-10-15: qty 1

## 2017-10-15 MED ORDER — AMLODIPINE BESYLATE 2.5 MG PO TABS
2.5000 mg | ORAL_TABLET | Freq: Every day | ORAL | Status: DC
  Administered 2017-10-15: 2.5 mg via ORAL
  Filled 2017-10-15: qty 1

## 2017-10-15 MED ORDER — ASPIRIN 81 MG PO CHEW: 81.0000 mg | CHEWABLE_TABLET | ORAL | Status: DC

## 2017-10-15 MED ORDER — CLOPIDOGREL BISULFATE 75 MG PO TABS
75.0000 mg | ORAL_TABLET | Freq: Every day | ORAL | Status: DC
  Administered 2017-10-16: 08:00:00 75 mg via ORAL
  Filled 2017-10-15: qty 1

## 2017-10-15 MED ORDER — NITROGLYCERIN 1 MG/10 ML FOR IR/CATH LAB
INTRA_ARTERIAL | Status: DC | PRN
  Administered 2017-10-15 (×3): 200 ug via INTRACORONARY

## 2017-10-15 MED ORDER — FENTANYL CITRATE (PF) 100 MCG/2ML IJ SOLN
INTRAMUSCULAR | Status: DC | PRN
  Administered 2017-10-15: 50 ug via INTRAVENOUS

## 2017-10-15 MED ORDER — TRAMADOL HCL 50 MG PO TABS: 50.0000 mg | ORAL_TABLET | Freq: Four times a day (QID) | ORAL | Status: DC | PRN

## 2017-10-15 MED ORDER — DOCUSATE SODIUM 100 MG PO CAPS: 100.0000 mg | ORAL_CAPSULE | Freq: Two times a day (BID) | ORAL | Status: DC

## 2017-10-15 MED ORDER — ASPIRIN EC 81 MG PO TBEC
81.0000 mg | DELAYED_RELEASE_TABLET | Freq: Every day | ORAL | Status: DC
  Administered 2017-10-15: 81 mg via ORAL
  Filled 2017-10-15: qty 1

## 2017-10-15 MED ORDER — ROSUVASTATIN CALCIUM 10 MG PO TABS
10.0000 mg | ORAL_TABLET | Freq: Every day | ORAL | Status: DC
  Administered 2017-10-15: 22:00:00 10 mg via ORAL
  Filled 2017-10-15: qty 1

## 2017-10-15 MED ORDER — HEPARIN SODIUM (PORCINE) 1000 UNIT/ML IJ SOLN
INTRAMUSCULAR | Status: DC | PRN
  Administered 2017-10-15: 4500 [IU] via INTRAVENOUS
  Administered 2017-10-15: 2000 [IU] via INTRAVENOUS
  Administered 2017-10-15: 4500 [IU] via INTRAVENOUS
  Administered 2017-10-15: 3000 [IU] via INTRAVENOUS

## 2017-10-15 MED ORDER — NITROGLYCERIN 1 MG/10 ML FOR IR/CATH LAB
INTRA_ARTERIAL | Status: AC
  Filled 2017-10-15: qty 10

## 2017-10-15 MED ORDER — VERAPAMIL HCL 2.5 MG/ML IV SOLN
INTRAVENOUS | Status: AC
  Filled 2017-10-15: qty 2

## 2017-10-15 MED ORDER — IOHEXOL 350 MG/ML SOLN
INTRAVENOUS | Status: DC | PRN
  Administered 2017-10-15: 225 mL via INTRA_ARTERIAL

## 2017-10-15 MED ORDER — ACETAMINOPHEN 325 MG PO TABS: 650.0000 mg | ORAL_TABLET | ORAL | Status: DC | PRN

## 2017-10-15 MED ORDER — LIDOCAINE HCL (PF) 1 % IJ SOLN
INTRAMUSCULAR | Status: AC
  Filled 2017-10-15: qty 30

## 2017-10-15 MED ORDER — SODIUM CHLORIDE 0.9 % IV SOLN: 250.0000 mL | INTRAVENOUS | Status: DC | PRN

## 2017-10-15 MED ORDER — ANGIOPLASTY BOOK
Freq: Once | Status: AC
  Administered 2017-10-15: 22:00:00
  Filled 2017-10-15: qty 1

## 2017-10-15 MED ORDER — HEPARIN (PORCINE) IN NACL 1000-0.9 UT/500ML-% IV SOLN
INTRAVENOUS | Status: AC
  Filled 2017-10-15: qty 1000

## 2017-10-15 MED ORDER — HYDRALAZINE HCL 20 MG/ML IJ SOLN: 5.0000 mg | INTRAMUSCULAR | Status: AC | PRN

## 2017-10-15 MED ORDER — FENTANYL CITRATE (PF) 100 MCG/2ML IJ SOLN
INTRAMUSCULAR | Status: AC
  Filled 2017-10-15: qty 2

## 2017-10-15 MED ORDER — MIDAZOLAM HCL 2 MG/2ML IJ SOLN
INTRAMUSCULAR | Status: AC
  Filled 2017-10-15: qty 2

## 2017-10-15 MED ORDER — SODIUM CHLORIDE 0.9 % IV SOLN
250.0000 mL | INTRAVENOUS | Status: DC | PRN
Start: 2017-10-15 — End: 2017-10-16

## 2017-10-15 MED ORDER — SODIUM CHLORIDE 0.9 % WEIGHT BASED INFUSION: 1.0000 mL/kg/h | INTRAVENOUS | Status: DC

## 2017-10-15 MED ORDER — METOPROLOL TARTRATE 50 MG PO TABS
50.0000 mg | ORAL_TABLET | Freq: Two times a day (BID) | ORAL | Status: DC
  Administered 2017-10-15: 50 mg via ORAL
  Filled 2017-10-15: qty 1
  Filled 2017-10-15: qty 2
  Filled 2017-10-15: qty 1

## 2017-10-15 MED ORDER — LIDOCAINE HCL (PF) 1 % IJ SOLN
INTRAMUSCULAR | Status: DC | PRN
  Administered 2017-10-15: 4 mL via INTRADERMAL

## 2017-10-15 SURGICAL SUPPLY — 31 items
BALLN EUPHORA RX 2.5X15 (BALLOONS) ×2
BALLN SAPPHIRE 1.0X10 (BALLOONS) ×2
BALLN SAPPHIRE 1.5X15 (BALLOONS) ×2
BALLN SAPPHIRE 2.0X15 (BALLOONS) ×2
BALLN SAPPHIRE ~~LOC~~ 3.0X12 (BALLOONS) ×1 IMPLANT
BALLOON EUPHORA RX 2.5X15 (BALLOONS) IMPLANT
BALLOON SAPPHIRE 1.0X10 (BALLOONS) IMPLANT
BALLOON SAPPHIRE 1.5X15 (BALLOONS) IMPLANT
BALLOON SAPPHIRE 2.0X15 (BALLOONS) IMPLANT
CATH INFINITI 5 FR 3DRC (CATHETERS) ×1 IMPLANT
CATH INFINITI 5FR ANG PIGTAIL (CATHETERS) ×1 IMPLANT
CATH INFINITI 5FR JL4 (CATHETERS) ×1 IMPLANT
CATH INFINITI JR4 5F (CATHETERS) ×1 IMPLANT
CATH LAUNCHER 5F RADR (CATHETERS) IMPLANT
CATH OPTITORQUE TIG 4.0 5F (CATHETERS) ×1 IMPLANT
CATH VISTA GUIDE 6FR XB3.5 (CATHETERS) ×1 IMPLANT
CATHETER LAUNCHER 5F RADR (CATHETERS) ×2
DEVICE RAD COMP TR BAND LRG (VASCULAR PRODUCTS) ×1 IMPLANT
GUIDEWIRE INQWIRE 1.5J.035X260 (WIRE) IMPLANT
INQWIRE 1.5J .035X260CM (WIRE) ×2
KIT ENCORE 26 ADVANTAGE (KITS) ×1 IMPLANT
KIT HEART LEFT (KITS) ×2 IMPLANT
NDL PERC 21GX4CM (NEEDLE) IMPLANT
NEEDLE PERC 21GX4CM (NEEDLE) ×2 IMPLANT
PACK CARDIAC CATHETERIZATION (CUSTOM PROCEDURE TRAY) ×2 IMPLANT
SHEATH RAIN RADIAL 21G 6FR (SHEATH) ×1 IMPLANT
STENT SIERRA 2.75 X 18 MM (Permanent Stent) ×1 IMPLANT
TRANSDUCER W/STOPCOCK (MISCELLANEOUS) ×2 IMPLANT
TUBING CIL FLEX 10 FLL-RA (TUBING) ×2 IMPLANT
WIRE MARVEL STR TIP 190CM (WIRE) ×1 IMPLANT
WIRE PT2 MS 185 (WIRE) ×1 IMPLANT

## 2017-10-15 NOTE — Progress Notes (Signed)
Patient arrived to floor via bed. TR band in place to left arm, level 0. Wife is at bedside. Left arm elevated on pillow, wife and patient given stent card and instructed to keep in patient's wallet. No S/S of distress noted or complaints voiced at this time. Call bell is in reach and bed is in lowest position. Bed alarm in place.

## 2017-10-15 NOTE — Interval H&P Note (Signed)
History and Physical Interval Note:  10/15/2017 7:21 AM  [place]  has presented today for surgery, with the diagnosis of abnormal CTA with frequent PVCs.  The various methods of treatment have been discussed with the patient and family. After consideration of risks, benefits and other options for treatment, the patient has consented to  Procedure(s): LEFT HEART CATH AND CORONARY ANGIOGRAPHY (N/A) with possible PERCUTANEOUS CORONARY INTERVENTION as a surgical intervention .  The patient's history has been reviewed, patient examined, no change in status, stable for surgery.  I have reviewed the patient's chart and labs.  Questions were answered to the patient's satisfaction.    Cath Lab Visit (complete for each Cath Lab visit)  Clinical Evaluation Leading to the Procedure:   ACS: No.  Non-ACS:    Anginal Classification: CCS II  Anti-ischemic medical therapy: Maximal Therapy (2 or more classes of medications)  Non-Invasive Test Results: High-risk stress test findings: cardiac mortality >3%/year  Prior CABG: No previous CABG   Glenetta Hew

## 2017-10-15 NOTE — Progress Notes (Signed)
[facility] BAND REMOVAL  LOCATION:    left radial  DEFLATED PER PROTOCOL:    Yes.    TIME BAND OFF / DRESSING APPLIED:    1400   SITE UPON ARRIVAL:    Level 0  SITE AFTER BAND REMOVAL:    Level 0  CIRCULATION SENSATION AND MOVEMENT:    Within Normal Limits   Yes.    COMMENTS:   Small oozing noted at site. Dressing applied C/D/I. Post removal instructions provided. Extremity remained elevated on pillow. Wife at bedside. Teach back effective.

## 2017-10-16 DIAGNOSIS — R9389 Abnormal findings on diagnostic imaging of other specified body structures: Secondary | ICD-10-CM

## 2017-10-16 DIAGNOSIS — I2584 Coronary atherosclerosis due to calcified coronary lesion: Secondary | ICD-10-CM | POA: Diagnosis not present

## 2017-10-16 DIAGNOSIS — Z7982 Long term (current) use of aspirin: Secondary | ICD-10-CM | POA: Diagnosis not present

## 2017-10-16 DIAGNOSIS — E785 Hyperlipidemia, unspecified: Secondary | ICD-10-CM | POA: Diagnosis not present

## 2017-10-16 DIAGNOSIS — I2582 Chronic total occlusion of coronary artery: Secondary | ICD-10-CM | POA: Diagnosis not present

## 2017-10-16 DIAGNOSIS — I10 Essential (primary) hypertension: Secondary | ICD-10-CM | POA: Diagnosis not present

## 2017-10-16 DIAGNOSIS — I493 Ventricular premature depolarization: Secondary | ICD-10-CM | POA: Diagnosis not present

## 2017-10-16 DIAGNOSIS — R911 Solitary pulmonary nodule: Secondary | ICD-10-CM | POA: Diagnosis not present

## 2017-10-16 DIAGNOSIS — I25119 Atherosclerotic heart disease of native coronary artery with unspecified angina pectoris: Secondary | ICD-10-CM | POA: Diagnosis not present

## 2017-10-16 LAB — BASIC METABOLIC PANEL
ANION GAP: 8 (ref 5–15)
BUN: 15 mg/dL (ref 6–20)
CALCIUM: 8.8 mg/dL — AB (ref 8.9–10.3)
CO2: 26 mmol/L (ref 22–32)
Chloride: 103 mmol/L (ref 101–111)
Creatinine, Ser: 0.93 mg/dL (ref 0.61–1.24)
GFR calc Af Amer: >60 mL/min (ref >60)
GFR calc non Af Amer: >60 mL/min (ref >60)
Glucose, Bld: 112 mg/dL — ABNORMAL HIGH (ref 65–99)
Potassium: 4.2 mmol/L (ref 3.5–5.1)
Sodium: 137 mmol/L (ref 135–145)

## 2017-10-16 LAB — CBC
HCT: 40 % (ref 39.0–52.0)
Hemoglobin: 13.3 g/dL (ref 13.0–17.0)
MCH: 30.1 pg (ref 26.0–34.0)
MCHC: 33.3 g/dL (ref 30.0–36.0)
MCV: 90.5 fL (ref 78.0–100.0)
Platelets: 154 10*3/uL (ref 150–400)
RBC: 4.42 MIL/uL (ref 4.22–5.81)
RDW: 12.9 % (ref 11.5–15.5)
WBC: 5.8 10*3/uL (ref 4.0–10.5)

## 2017-10-16 MED ORDER — METOPROLOL TARTRATE 25 MG PO TABS: 25.0000 mg | ORAL_TABLET | Freq: Two times a day (BID) | ORAL | 3 refills | Status: DC

## 2017-10-16 MED ORDER — ROSUVASTATIN CALCIUM 20 MG PO TABS: 20.0000 mg | ORAL_TABLET | Freq: Every day | ORAL | Status: DC

## 2017-10-16 MED ORDER — ROSUVASTATIN CALCIUM 20 MG PO TABS: 20.0000 mg | ORAL_TABLET | Freq: Every day | ORAL | 3 refills | Status: DC

## 2017-10-16 MED ORDER — METOPROLOL TARTRATE 25 MG PO TABS
25.0000 mg | ORAL_TABLET | Freq: Two times a day (BID) | ORAL | Status: DC
  Administered 2017-10-16: 10:00:00 25 mg via ORAL
  Filled 2017-10-16: qty 1

## 2017-10-16 MED ORDER — CLOPIDOGREL BISULFATE 75 MG PO TABS: 75.0000 mg | ORAL_TABLET | Freq: Every day | ORAL | 3 refills | Status: DC

## 2017-10-16 MED FILL — Heparin Sod (Porcine)-NaCl IV Soln 1000 Unit/500ML-0.9%: INTRAVENOUS | Qty: 1000 | Status: AC

## 2017-10-16 NOTE — Progress Notes (Signed)
CARDIAC REHAB PHASE I   PRE:  Rate/Rhythm: 45 SB  BP:  Supine:   Sitting: 123/59  Standing:    SaO2:   MODE:  Ambulation: 800 ft   POST:  Rate/Rhythm: 59-60 SB, PVC  BP:  Supine:   Sitting: 141/68  Standing:    SaO2:  0955-1053 Pt walked 800 ft with steady gait and no CP. Tolerated well. Stressed importance of plavix with stent. Reviewed NTG use, heart healthy food choices, ex ed and CRP 2. Referring to [facility] program.   Graylon Good, RN BSN  10/16/2017 10:49 AM

## 2017-10-16 NOTE — Discharge Instructions (Signed)

## 2017-10-16 NOTE — Progress Notes (Signed)
Progress Note  Patient Name: Matthew Briggs Date of Encounter: 10/16/2017  Primary Cardiologist:   No primary care provider on file.   Subjective   No chest pain.  No SOB.   Inpatient Medications    Scheduled Meds: . amLODipine  2.5 mg Oral QHS  . aspirin EC  81 mg Oral QHS  . clopidogrel  75 mg Oral Q breakfast  . metoprolol tartrate  50 mg Oral BID  . rosuvastatin  10 mg Oral QHS  . sodium chloride flush  3 mL Intravenous Q12H   Continuous Infusions: . sodium chloride     PRN Meds: sodium chloride, acetaminophen, ondansetron (ZOFRAN) IV, sodium chloride flush, traMADol   Vital Signs    Vitals:   10/15/17 2148 10/16/17 0622 10/16/17 0623 10/16/17 0719  BP:  (!) 151/60 (!) 151/60 (!) 111/51  Pulse: 61 (!) 47  (!) 47  Resp:  13 12 13   Temp:  97.6 F (36.4 C)  98.1 F (36.7 C)  TempSrc:  Oral    SpO2:  97%  98%  Weight:  187 lb 6.3 oz (85 kg)    Height:        Intake/Output Summary (Last 24 hours) at 10/16/2017 0812 Last data filed at 10/15/2017 1845 Gross per 24 hour  Intake 832.5 ml  Output 1625 ml  Net -792.5 ml   Filed Weights   10/15/17 0542 10/16/17 0622  Weight: 183 lb (83 kg) 187 lb 6.3 oz (85 kg)    Telemetry    NSR, sinus brady - Personally Reviewed  ECG    NA - Personally Reviewed  Physical Exam   GEN: No acute distress.   Neck: No  JVD Cardiac: RRR, no murmurs, rubs, or gallops.  Respiratory: Clear  to auscultation bilaterally. GI: Soft, nontender, non-distended  MS: No  edema; No deformity.  Right radial without bleeding.  Slight bruise.  Neuro:  Nonfocal  Psych: Normal affect   Labs    Chemistry Recent Labs  Lab 10/16/17 0253  NA 137  K 4.2  CL 103  CO2 26  GLUCOSE 112*  BUN 15  CREATININE 0.93  CALCIUM 8.8*  GFRNONAA >60  GFRAA >60  ANIONGAP 8     Hematology Recent Labs  Lab 10/16/17 0253  WBC 5.8  RBC 4.42  HGB 13.3  HCT 40.0  MCV 90.5  MCH 30.1  MCHC 33.3  RDW 12.9  PLT 154     Cardiac  EnzymesNo results for input(s): TROPONINI in the last 168 hours. No results for input(s): TROPIPOC in the last 168 hours.   BNPNo results for input(s): BNP, PROBNP in the last 168 hours.   DDimer No results for input(s): DDIMER in the last 168 hours.   Radiology    No results found.  Cardiac Studies   Cath   Prox LAD lesion is 20% stenosed. Ost 1st Diag lesion is 55% stenosed.  Ost 1st Mrg lesion is 65% stenosed.  Mid Cx-2 lesion is 99% stenosed: CULPRIT LESION  A drug-eluting stent was successfully placed using a STENT SIERRA 2.75 X 18 MM. - postdilated to 3.0 mm  Post intervention, there is a 0% residual stenosis.  Dist Cx-1 lesion is 85% stenosed.  Balloon angioplasty was performed using a BALLOON SAPPHIRE 2.0X15. Post intervention, there is a 75% residual stenosis.  Dist Cx-2 lesion is 99% stenosed with 99% stenosed side branch in Ost LPDA. -  Balloon angioplasty was performed using a BALLOON SAPPHIRE 1.0X10. - Unable to  cross with 1 mm balloon.  Post intervention, there is a 99% residual stenosis.  Prox RCA to Mid small caliber, nondominant RCA lesion is 100% stenosed.   Severe two-vessel disease with subtotally occluded mid circumflex followed by chronic total occlusion of the posterior AV groove branch of the PDA. 100% occluded small nondominant RCA. Successful DES stent to the mid Cx, but unable to cross the CTO of the distal vessel in the posterior AVGroove-LPDA   Patient Profile     72 y.o. male with elevated coronary calcium, abnormal POET with frequent ventricular ectopy and CTA with high grade stenosis who has had some dyspnea and palpitations.    Assessment & Plan    CAD:  Medical management.  Will reduce beta blocker because of low HR.  Increase the Crestor to 20 mg.    PULMONARY NODULE:   Follow up in April next year.    HTN:  Manage in the context of treating his CAD.     For questions or updates, please contact [place] Please consult  [email or web address] for contact info under Cardiology/STEMI.   Signed, Minus Breeding, MD  10/16/2017, 8:12 AM
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
## 2017-10-18 ENCOUNTER — Telehealth: Payer: Self-pay | Admitting: Cardiology

## 2017-10-18 DIAGNOSIS — S56494D Other injury of extensor muscle, fascia and tendon of left middle finger at forearm level, subsequent encounter: Secondary | ICD-10-CM | POA: Diagnosis not present

## 2017-10-18 DIAGNOSIS — S63283D Dislocation of proximal interphalangeal joint of left middle finger, subsequent encounter: Secondary | ICD-10-CM | POA: Diagnosis not present

## 2017-10-18 NOTE — Telephone Encounter (Signed)
New Message:       Pt c/o medication issue:  1. Name of Medication:  metoprolol tartrate (LOPRESSOR) 25 MG tablet  rosuvastatin (CRESTOR) 20 MG tablet  clopidogrel (PLAVIX) 75 MG tablet   2. How are you currently taking this medication (dosage and times per day)?  Take 1 tablet (25 mg total) by mouth 2 (two) times daily.(lopressor)  Take 1 tablet (20 mg total) by mouth at bedtime.(crestor)  Take 1 tablet (75 mg total) by mouth daily with breakfast.(plavix)   3. Are you having a reaction (difficulty breathing--STAT)? No  4. What is your medication issue? Pt needs some clarification on how he is supposed to take this medication due to receiving some different instructions from the ER. Pt would also like to know if he can take his plavix in the evening.

## 2017-10-18 NOTE — Telephone Encounter (Signed)
Left message to call back  

## 2017-10-18 NOTE — Telephone Encounter (Signed)
Spoke to patient, patient wanted to clarify his medications he is suppose to take.  He states that he was not taking metoprolol prior to cath and this was reduced in the hospital due to low HR.    Also wondering if he can take plavix at night and was suppose to increase Crestor to 20 mg daily.   Advised ok to take at night and to increase rosuvastatin to 20 mg.   Advised he was started on metoprolol 50 mg bid in hospital and this was reduced due to low HR.  He has been taking 25 mg BID but does not check his HR.  He is asymptomatic.  Advised to continue current dose and monitor BP and HR.  Patient verbalized understanding.

## 2017-10-19 NOTE — Telephone Encounter (Signed)
Agree with instructions

## 2017-10-24 ENCOUNTER — Encounter: Payer: Self-pay | Admitting: Cardiology

## 2017-10-24 ENCOUNTER — Ambulatory Visit (INDEPENDENT_AMBULATORY_CARE_PROVIDER_SITE_OTHER): Payer: PPO | Admitting: Cardiology

## 2017-10-24 VITALS — BP 120/64 | HR 66 | Ht 69.0 in | Wt 186.4 lb

## 2017-10-24 DIAGNOSIS — E785 Hyperlipidemia, unspecified: Secondary | ICD-10-CM

## 2017-10-24 DIAGNOSIS — Z9861 Coronary angioplasty status: Secondary | ICD-10-CM

## 2017-10-24 DIAGNOSIS — E782 Mixed hyperlipidemia: Secondary | ICD-10-CM

## 2017-10-24 DIAGNOSIS — I493 Ventricular premature depolarization: Secondary | ICD-10-CM

## 2017-10-24 DIAGNOSIS — I10 Essential (primary) hypertension: Secondary | ICD-10-CM

## 2017-10-24 DIAGNOSIS — I25119 Atherosclerotic heart disease of native coronary artery with unspecified angina pectoris: Secondary | ICD-10-CM

## 2017-10-24 DIAGNOSIS — I2510 Atherosclerotic heart disease of native coronary artery without angina pectoris: Secondary | ICD-10-CM

## 2017-10-24 DIAGNOSIS — Z79899 Other long term (current) drug therapy: Secondary | ICD-10-CM

## 2017-10-24 MED ORDER — METOPROLOL TARTRATE 25 MG PO TABS: 12.5000 mg | ORAL_TABLET | Freq: Two times a day (BID) | ORAL | 3 refills | Status: DC

## 2017-10-24 MED ORDER — ROSUVASTATIN CALCIUM 20 MG PO TABS: 20.0000 mg | ORAL_TABLET | Freq: Every day | ORAL | 3 refills | Status: DC
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
## 2017-10-24 NOTE — Assessment & Plan Note (Signed)
Controlled.  

## 2017-10-24 NOTE — Assessment & Plan Note (Signed)
S/P CFX PCI with DES 10/15/17 Residual CAD in dCFX, CTO RCA with R-R and L-R collaterals, and moderate residual OM and Dx disease

## 2017-10-24 NOTE — Patient Instructions (Signed)
Medication Instructions: Decrease: Metoprolol 12.5 mg two times a day   If you need a refill on your cardiac medications before your next appointment, please call your pharmacy.   Labwork: Your physician recommends that you return for lab work in: 2 month (Lipid, CMP)   Procedures/Testing: Your physician has requested that you have an echocardiogram. Echocardiography is a painless test that uses sound waves to create images of your heart. It provides your doctor with information about the size and shape of your heart and how well your heart's chambers and valves are working. This procedure takes approximately one hour. There are no restrictions for this procedure. [place] [address]  Follow-Up: Your physician wants you to follow-up in 3 month with Dr. Percival Spanish.   Special Instructions:    Thank you for choosing [facility] at [facility]!!

## 2017-10-24 NOTE — Assessment & Plan Note (Signed)
I will ask him to try Lopressor 12.5 mg BID

## 2017-10-24 NOTE — Assessment & Plan Note (Signed)
Chronic per his history

## 2017-10-24 NOTE — Assessment & Plan Note (Signed)
Crestor increased to 20 mg, check f/u lipids and CMET in 2 months

## 2017-10-25 DIAGNOSIS — S56494D Other injury of extensor muscle, fascia and tendon of left middle finger at forearm level, subsequent encounter: Secondary | ICD-10-CM | POA: Diagnosis not present

## 2017-10-25 DIAGNOSIS — S63283D Dislocation of proximal interphalangeal joint of left middle finger, subsequent encounter: Secondary | ICD-10-CM | POA: Diagnosis not present

## 2017-10-29 ENCOUNTER — Telehealth (HOSPITAL_COMMUNITY): Payer: Self-pay

## 2017-10-29 NOTE — Telephone Encounter (Signed)
Attempted to call patient in regards to Cardiac Rehab - lm on vm °

## 2017-10-29 NOTE — Telephone Encounter (Signed)
Patients insurance is active and benefits verified through [employer] - $15.00 co-pay, no deductible, out of pocket amount of $3,400/$914.65 has been met, no co-insurance, and no pre-authorization is required. Reference [id number]

## 2017-11-14 ENCOUNTER — Encounter (HOSPITAL_COMMUNITY): Payer: Self-pay

## 2017-11-14 ENCOUNTER — Telehealth (HOSPITAL_COMMUNITY): Payer: Self-pay

## 2017-11-14 NOTE — Telephone Encounter (Signed)
2nd attempt to contact patient in regards to Cardiac Rehab - lm on vm. Sending letter. °

## 2017-11-26 ENCOUNTER — Telehealth (HOSPITAL_COMMUNITY): Payer: Self-pay

## 2017-11-26 ENCOUNTER — Telehealth: Payer: Self-pay | Admitting: Cardiology

## 2017-11-26 NOTE — Telephone Encounter (Signed)
Returned the call to the patient's wife, per the dpr. The patient is due for a 3 month follow up with Dr. Percival Spanish. Appointment has been made for 8/12 with Dr. Percival Spanish.  He has been advised to keep his appointment on 7/3 with [name], PA due to the patient complaining of low heart rates in the 40's. The patient is currently out of the country and will be back later tonight. They will call back if anything further is needed.

## 2017-11-26 NOTE — Telephone Encounter (Signed)
Wife of patient returned phone call to schedule patient for Cardiac Rehab - Scheduled orientation on 01/03/18 at 7:30am. Patient will attend the 6:45am exc class. Mailed packet.

## 2017-11-26 NOTE — Telephone Encounter (Signed)
3rd attempt to contact patient in regards to Cardiac Rehab - lm on vm °

## 2017-11-26 NOTE — Telephone Encounter (Signed)
New Message   Pt's wife states that Dr. Percival Spanish wanted to see him asap, informed of his first available 8/12 and then provided 7/3 with a PA and she took the appt with Barrett but wants to make sure its ok with Hochrein

## 2017-11-28 ENCOUNTER — Ambulatory Visit (INDEPENDENT_AMBULATORY_CARE_PROVIDER_SITE_OTHER): Payer: PPO | Admitting: Physician Assistant

## 2017-11-28 ENCOUNTER — Encounter: Payer: Self-pay | Admitting: Physician Assistant

## 2017-11-28 VITALS — BP 116/66 | HR 62 | Ht 69.0 in | Wt 183.0 lb

## 2017-11-28 DIAGNOSIS — Z9861 Coronary angioplasty status: Secondary | ICD-10-CM | POA: Diagnosis not present

## 2017-11-28 DIAGNOSIS — Z79899 Other long term (current) drug therapy: Secondary | ICD-10-CM

## 2017-11-28 DIAGNOSIS — I4891 Unspecified atrial fibrillation: Secondary | ICD-10-CM

## 2017-11-28 DIAGNOSIS — I2510 Atherosclerotic heart disease of native coronary artery without angina pectoris: Secondary | ICD-10-CM | POA: Diagnosis not present

## 2017-11-28 DIAGNOSIS — E785 Hyperlipidemia, unspecified: Secondary | ICD-10-CM | POA: Diagnosis not present

## 2017-11-28 DIAGNOSIS — R252 Cramp and spasm: Secondary | ICD-10-CM | POA: Diagnosis not present

## 2017-11-28 DIAGNOSIS — I10 Essential (primary) hypertension: Secondary | ICD-10-CM

## 2017-11-28 MED ORDER — RIVAROXABAN 20 MG PO TABS: 20.0000 mg | ORAL_TABLET | Freq: Every day | ORAL | 3 refills | Status: DC

## 2017-11-28 NOTE — Patient Instructions (Addendum)
Medication Instructions:  START Xarelto 20 mg daily STOP metoprolol (Lopressor) STOP amlodipine (Norvasc) --restart amlodipine if swelling remains unchanged after approximately 5 days   Call if you still experience low heart rates and are symptomatic  Labwork: TODAY-CMET, Lipid, mag  Follow-Up: August with Dr. Percival Spanish as scheduled  Any Other Special Instructions Will Be Listed Below (If Applicable).     If you need a refill on your cardiac medications before your next appointment, please call your pharmacy.
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
## 2017-11-29 LAB — COMPREHENSIVE METABOLIC PANEL
A/G RATIO: 1.6 (ref 1.2–2.2)
ALBUMIN: 4.5 g/dL (ref 3.5–4.8)
ALT: 39 IU/L (ref 0–44)
AST: 26 IU/L (ref 0–40)
Alkaline Phosphatase: 60 IU/L (ref 39–117)
BILIRUBIN TOTAL: 0.5 mg/dL (ref 0.0–1.2)
BUN / CREAT RATIO: 17 (ref 10–24)
BUN: 14 mg/dL (ref 8–27)
CO2: 24 mmol/L (ref 20–29)
Calcium: 9.3 mg/dL (ref 8.6–10.2)
Chloride: 98 mmol/L (ref 96–106)
Creatinine, Ser: 0.82 mg/dL (ref 0.76–1.27)
GFR calc Af Amer: 104 mL/min/{1.73_m2} (ref >59)
GFR calc non Af Amer: 90 mL/min/{1.73_m2} (ref >59)
GLOBULIN, TOTAL: 2.9 g/dL (ref 1.5–4.5)
Glucose: 92 mg/dL (ref 65–99)
POTASSIUM: 4.1 mmol/L (ref 3.5–5.2)
SODIUM: 138 mmol/L (ref 134–144)
TOTAL PROTEIN: 7.4 g/dL (ref 6.0–8.5)

## 2017-11-29 LAB — LIPID PANEL
CHOLESTEROL TOTAL: 140 mg/dL (ref 100–199)
Chol/HDL Ratio: 2.2 ratio (ref 0.0–5.0)
HDL: 64 mg/dL (ref >39)
LDL Calculated: 51 mg/dL (ref 0–99)
TRIGLYCERIDES: 124 mg/dL (ref 0–149)
VLDL Cholesterol Cal: 25 mg/dL (ref 5–40)

## 2017-11-29 LAB — MAGNESIUM: Magnesium: 2.1 mg/dL (ref 1.6–2.3)

## 2017-12-03 ENCOUNTER — Telehealth: Payer: Self-pay

## 2017-12-03 NOTE — Telephone Encounter (Signed)
Spoke with patient and he stated he is doing better, the swelling is better and his blood pressure has been ok. 96-130s for top number and 50-60s for bottom number. 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

## 2017-12-03 NOTE — Telephone Encounter (Signed)
-----   Message from Lonn Georgia, PA-C sent at 11/30/2017  4:19 PM EDT ----- Please let him know that his labs were fine. Cholesterol is at target, electrolytes and kidney function are good. See if the lower extremity edema has improved off the amlodipine and ask him if he can get some blood pressure readings to make sure his pressure is not running too high off the amlodipine. Thanks

## 2017-12-04 ENCOUNTER — Ambulatory Visit (HOSPITAL_COMMUNITY): Payer: PPO | Attending: Cardiovascular Disease

## 2017-12-04 ENCOUNTER — Telehealth: Payer: Self-pay

## 2017-12-04 ENCOUNTER — Other Ambulatory Visit: Payer: Self-pay

## 2017-12-04 DIAGNOSIS — Z8249 Family history of ischemic heart disease and other diseases of the circulatory system: Secondary | ICD-10-CM | POA: Insufficient documentation

## 2017-12-04 DIAGNOSIS — E785 Hyperlipidemia, unspecified: Secondary | ICD-10-CM | POA: Insufficient documentation

## 2017-12-04 DIAGNOSIS — I2510 Atherosclerotic heart disease of native coronary artery without angina pectoris: Secondary | ICD-10-CM | POA: Insufficient documentation

## 2017-12-04 DIAGNOSIS — I25119 Atherosclerotic heart disease of native coronary artery with unspecified angina pectoris: Secondary | ICD-10-CM

## 2017-12-04 DIAGNOSIS — I10 Essential (primary) hypertension: Secondary | ICD-10-CM | POA: Diagnosis not present

## 2017-12-04 DIAGNOSIS — R600 Localized edema: Secondary | ICD-10-CM | POA: Insufficient documentation

## 2017-12-04 NOTE — Telephone Encounter (Signed)
Patient was called by my colleague and I will wait for him to contact office.

## 2017-12-04 NOTE — Telephone Encounter (Signed)
Attempted to reach pt but there was no answer, I left a message on pt's voicemail with medication instructions. I instructed him to give our office a call once he receives message or either send [place] a message though mychart.

## 2017-12-04 NOTE — Telephone Encounter (Signed)
-----   Message from Lonn Georgia, PA-C sent at 11/30/2017  4:21 PM EDT ----- I sent you note about calling him for his labs, can you also make sure he knows about the aspirin? Thanks ----- Message ----- From: Reola Mosher Sent: 11/28/2017   2:31 PM To: Dionne Bucy Truitt, CMA, Lonn Georgia, PA-C  Please call him and ask him to stop ASA, let him know I spoke to Dr Hochrein's partner about this. Thanks

## 2017-12-05 NOTE — Telephone Encounter (Signed)
See result note.  

## 2017-12-13 DIAGNOSIS — S63283D Dislocation of proximal interphalangeal joint of left middle finger, subsequent encounter: Secondary | ICD-10-CM | POA: Diagnosis not present

## 2017-12-13 DIAGNOSIS — S56494D Other injury of extensor muscle, fascia and tendon of left middle finger at forearm level, subsequent encounter: Secondary | ICD-10-CM | POA: Diagnosis not present

## 2017-12-17 DIAGNOSIS — M25511 Pain in right shoulder: Secondary | ICD-10-CM | POA: Diagnosis not present

## 2017-12-17 DIAGNOSIS — S63283D Dislocation of proximal interphalangeal joint of left middle finger, subsequent encounter: Secondary | ICD-10-CM | POA: Diagnosis not present

## 2017-12-18 DIAGNOSIS — D225 Melanocytic nevi of trunk: Secondary | ICD-10-CM | POA: Diagnosis not present

## 2017-12-18 DIAGNOSIS — Z85828 Personal history of other malignant neoplasm of skin: Secondary | ICD-10-CM | POA: Diagnosis not present

## 2017-12-18 DIAGNOSIS — D1801 Hemangioma of skin and subcutaneous tissue: Secondary | ICD-10-CM | POA: Diagnosis not present

## 2017-12-18 DIAGNOSIS — L812 Freckles: Secondary | ICD-10-CM | POA: Diagnosis not present

## 2017-12-18 DIAGNOSIS — L853 Xerosis cutis: Secondary | ICD-10-CM | POA: Diagnosis not present

## 2017-12-18 DIAGNOSIS — D2272 Melanocytic nevi of left lower limb, including hip: Secondary | ICD-10-CM | POA: Diagnosis not present

## 2017-12-18 DIAGNOSIS — D2271 Melanocytic nevi of right lower limb, including hip: Secondary | ICD-10-CM | POA: Diagnosis not present

## 2017-12-18 DIAGNOSIS — L821 Other seborrheic keratosis: Secondary | ICD-10-CM | POA: Diagnosis not present

## 2017-12-18 DIAGNOSIS — L570 Actinic keratosis: Secondary | ICD-10-CM | POA: Diagnosis not present

## 2017-12-19 DIAGNOSIS — S56494D Other injury of extensor muscle, fascia and tendon of left middle finger at forearm level, subsequent encounter: Secondary | ICD-10-CM | POA: Diagnosis not present

## 2017-12-19 DIAGNOSIS — S63283D Dislocation of proximal interphalangeal joint of left middle finger, subsequent encounter: Secondary | ICD-10-CM | POA: Diagnosis not present

## 2017-12-26 DIAGNOSIS — S63283D Dislocation of proximal interphalangeal joint of left middle finger, subsequent encounter: Secondary | ICD-10-CM | POA: Diagnosis not present

## 2017-12-26 DIAGNOSIS — S56494D Other injury of extensor muscle, fascia and tendon of left middle finger at forearm level, subsequent encounter: Secondary | ICD-10-CM | POA: Diagnosis not present

## 2017-12-27 ENCOUNTER — Telehealth (HOSPITAL_COMMUNITY): Payer: Self-pay

## 2017-12-27 DIAGNOSIS — S63283D Dislocation of proximal interphalangeal joint of left middle finger, subsequent encounter: Secondary | ICD-10-CM | POA: Diagnosis not present

## 2017-12-27 DIAGNOSIS — S56494D Other injury of extensor muscle, fascia and tendon of left middle finger at forearm level, subsequent encounter: Secondary | ICD-10-CM | POA: Diagnosis not present

## 2017-12-31 ENCOUNTER — Encounter: Payer: Self-pay | Admitting: Physician Assistant

## 2017-12-31 ENCOUNTER — Ambulatory Visit (INDEPENDENT_AMBULATORY_CARE_PROVIDER_SITE_OTHER): Payer: PPO | Admitting: Physician Assistant

## 2017-12-31 VITALS — BP 124/62 | HR 58 | Ht 69.0 in | Wt 184.0 lb

## 2017-12-31 DIAGNOSIS — I2510 Atherosclerotic heart disease of native coronary artery without angina pectoris: Secondary | ICD-10-CM | POA: Diagnosis not present

## 2017-12-31 DIAGNOSIS — I480 Paroxysmal atrial fibrillation: Secondary | ICD-10-CM | POA: Diagnosis not present

## 2017-12-31 DIAGNOSIS — I10 Essential (primary) hypertension: Secondary | ICD-10-CM

## 2017-12-31 DIAGNOSIS — Z9861 Coronary angioplasty status: Secondary | ICD-10-CM | POA: Diagnosis not present

## 2017-12-31 MED ORDER — VALSARTAN-HYDROCHLOROTHIAZIDE 160-12.5 MG PO TABS: 1.0000 | ORAL_TABLET | Freq: Every day | ORAL | 3 refills | Status: DC

## 2017-12-31 NOTE — Patient Instructions (Signed)
Medication Instructions:  DECREASE Valsartan/HCTZ to 160/12.5mg  Take 1 tablet once a day   Labwork: None   Testing/Procedures: None   Follow-Up: KEEP FOLLOW UP AS SCHEDULED  Any Other Special Instructions Will Be Listed Below (If Applicable). If you need a refill on your cardiac medications before your next appointment, please call your pharmacy.

## 2017-12-31 NOTE — Progress Notes (Signed)
Cardiology Office Note   Date:  12/31/2017   ID:  Matthew Briggs, Matthew Briggs [birth year], MRN [id number]  PCP:  Marton Redwood, MD  Cardiologist: Dr. Percival Spanish, 10/05/2017 [name], PA-C 11/28/2017  Chief Complaint  Patient presents with  . Follow-up    pt states he gets lightheaded when he gets up too fast but nothing new; no other Sx.    History of Present Illness: Matthew Briggs is a 72 y.o. male with a history of OA, HTN, HLD, hypogonadism, dx CAD 10/15/2017 w/ cath after abnl MV>>DES mCFX, PTCA dCFX, RCA 100%  7/3 office visit, patient diagnosed with atrial fibrillation, started on Xarelto, metoprolol stopped due to bradycardia, 3-week follow-up with plans for cardioversion if he does not spontaneously convert.  Amlodipine DC'd due to lower extremity edema lipid profile performed with LDL 51 and HDL [place] presents for cardiology follow up.   He still is having some problems w/ orthostatic dizziness. Especially when he gets up quickly.   He has a partial Achilles tendon tear on the L, the pain limits him walking. However, he is still doing 45" in the morning after using Voltaren gel. He has intended to see someone about that, never has gotten around to it.   Some gum bleeding with flossing, he uses an electric toothbrush 3 x day to prevent plaque buildup, not bleeding w/ brushing his teeth, just with flossing.   He is watching his BP/HR. BP 100-120/50-65. HR has been in the 50s. He was aware of the atrial fib mainly because of fluctuations in his heart rate.  He has not noticed those fluctuations recently.  He had no idea he was in atrial fibrillation, was never aware of it.  The only indication he had was that he had more bradycardia than usual.  Heart rate has been more stable lately.   Past Medical History:  Diagnosis Date  . Abnormal computed tomography angiography (CTA) 10/05/2017  . Abnormal MRI   . Arthritis    "hands" (10/15/2017)  . Coronary  artery disease   . Coronary artery disease involving native heart 10/15/2017  . Dyspnea 02/02/2015  . Essential hypertension 02/02/2015  . High coronary artery calcium score 05/10/2015  . HTN (hypertension)   . Hyperlipemia   . Hyperlipidemia with target LDL less than 70 02/02/2015  . Hypertension   . Hypogonadism, male   . Irregular heart beat   . Laceration of chest wall 08/08/2017  . Medication management 10/05/2017  . Other fatigue 10/05/2017  . PVC's (premature ventricular contractions) 01/27/2017    Past Surgical History:  Procedure Laterality Date  . CARPAL TUNNEL RELEASE Left 04/2017  . COLONOSCOPY    . CORONARY STENT INTERVENTION N/A 10/15/2017   Procedure: CORONARY STENT INTERVENTION;  Surgeon: Leonie Man, MD;  Location: [facility];  Service: Cardiovascular;  Laterality: N/A;  . FRACTURE SURGERY    . INGUINAL HERNIA REPAIR Right ~ 1970  . IRRIGATION AND DEBRIDEMENT ABSCESS  08/08/2017   Procedure: IRRIGATION AND DEBRIDEMENT OF CHEST;  Surgeon: Judeth Horn, MD;  Location: [facility];  Service: General;;  . LEFT HEART CATH AND CORONARY ANGIOGRAPHY N/A 10/15/2017   Procedure: LEFT HEART CATH AND CORONARY ANGIOGRAPHY;  Surgeon: Leonie Man, MD;  Location: [facility];  Service: Cardiovascular;  Laterality: N/A;  . MINOR IRRIGATION AND DEBRIDEMENT OF WOUND Left 08/08/2017   Procedure: CLOSED REDUCTION REPAIR OF PIP JOINT/LACERATION LEFT MIDDLE FINGER;  Surgeon: Milly Jakob, MD;  Location: [facility];  Service: Orthopedics;  Laterality: Left;  . OPEN REDUCTION INTERNAL FIXATION (ORIF) DISTAL PHALANX Left 08/13/2017   Procedure: OPEN TREATMENT OF LEFT LONG FINGER OPEN JOINT DISLOCATION OF PROXIMAL PHALANYX WITH EXTENSOR TENDON REPAIR;  Surgeon: Milly Jakob, MD;  Location: [facility];  Service: Orthopedics;  Laterality: Left;  . REPAIR EXTENSOR TENDON Left 08/13/2017   Procedure: REPAIR EXTENSOR TENDON;  Surgeon: Milly Jakob, MD;  Location: [facility];  Service: Orthopedics;  Laterality: Left;    Current Outpatient Medications  Medication Sig Dispense Refill  . acetaminophen (TYLENOL) 325 MG tablet Take 2 tablets (650 mg total) by mouth every 6 (six) hours. (Patient taking differently: Take 650 mg by mouth every 6 (six) hours as needed (headaches.). )    . Calcium-Phosphorus-Vitamin D (CITRACAL +D3 PO) Take 1 tablet by mouth daily with supper.    . cholecalciferol (VITAMIN D) 1000 UNITS tablet Take 1,000 Units by mouth daily with supper.     . clopidogrel (PLAVIX) 75 MG tablet Take 1 tablet (75 mg total) by mouth daily with breakfast. (Patient taking differently: Take 75 mg by mouth at bedtime. ) 90 tablet 3  . Coenzyme Q10 (CO Q 10) 100 MG CAPS Take 100 mg by mouth daily with supper. Qunol CoQ10    . diclofenac sodium (VOLTAREN) 1 % GEL Apply 2-4 g topically 4 (four) times daily as needed (for arthritis pain).    Javier Docker Oil 500 MG CAPS Take 500 mg by mouth daily with supper.    . methocarbamol (ROBAXIN) 500 MG tablet Take 1 tablet (500 mg total) by mouth every 8 (eight) hours as needed for muscle spasms. 15 tablet 0  . Multiple Vitamin (MULTIVITAMIN WITH MINERALS) TABS tablet Take 1 tablet by mouth daily with supper.     . Potassium 99 MG TABS Take 99 mg by mouth daily with supper.    . quiNINE (QUALAQUIN) 324 MG capsule Take 648 mg by mouth daily as needed (for cramps.).     Marland Kitchen rivaroxaban (XARELTO) 20 MG TABS tablet Take 1 tablet (20 mg total) by mouth daily with supper. 30 tablet 3  . rosuvastatin (CRESTOR) 20 MG tablet Take 1 tablet (20 mg total) by mouth at bedtime. 90 tablet 3  . traMADol (ULTRAM) 50 MG tablet Take 1 tablet (50 mg total) by mouth every 6 (six) hours as needed (mild pain). 20 tablet 0  . valsartan-hydrochlorothiazide (DIOVAN-HCT) 320-25 MG per tablet Take 1 tablet by mouth at bedtime.   1   No current facility-administered medications for this visit.     Allergies:   Patient has no known allergies.     Social History:  The patient  reports that he has never smoked. He has never used smokeless tobacco. He reports that he drinks about 4.2 oz of alcohol per week. He reports that he does not use drugs.   Family History:  The patient's family history includes CAD in his father; Heart attack (age of onset: 35) in his maternal grandfather; Heart attack (age of onset: 69) in his paternal grandfather.    ROS:  Please see the history of present illness. All other systems are reviewed and negative.    PHYSICAL EXAM: VS:  BP 124/62   Pulse (!) 58   Ht 5\' 9"  (1.753 m)   Wt 184 lb (83.5 kg)   BMI 27.17 kg/m  , BMI Body mass index is 27.17 kg/m. GEN: Well nourished, well developed, male in no  acute distress  HEENT: normal for age  Neck: no JVD, no carotid bruit, no masses Cardiac: RRR; no murmur, no rubs, or gallops Respiratory:  clear to auscultation bilaterally, normal work of breathing GI: soft, nontender, nondistended, + BS MS: no deformity or atrophy; no edema; distal pulses are 2+ in all 4 extremities   Skin: warm and dry, no rash Neuro:  Strength and sensation are intact Psych: euthymic mood, full affect   EKG:  EKG is ordered today. The ekg ordered today demonstrates sinus bradycardia, heart rate 55, no acute ischemic changes.  The only difference between this ECG and 11/28/2017 is rhythm change from A. fib to sinus  Left heart catheterization 10/15/17: Conclusion     Prox LAD lesion is 20% stenosed. Ost 1st Diag lesion is 55% stenosed.  Ost 1st Mrg lesion is 65% stenosed.  Mid Cx-2 lesion is 99% stenosed: CULPRIT LESION  A drug-eluting stent was successfully placed using a STENT SIERRA 2.75 X 18 MM. - postdilated to 3.0 mm  Post intervention, there is a 0% residual stenosis.  Dist Cx-1 lesion is 85% stenosed.  Balloon angioplasty was performed using a BALLOON SAPPHIRE 2.0X15. Post intervention, there is a 75% residual stenosis.  Dist Cx-2 lesion is 99% stenosed with  99% stenosed side branch in Ost LPDA. -  Balloon angioplasty was performed using a BALLOON SAPPHIRE 1.0X10. - Unable to cross with 1 mm balloon.  Post intervention, there is a 99% residual stenosis.  Prox RCA to Mid small caliber, nondominant RCA lesion is 100% stenosed.  Severe two-vessel disease with subtotally occluded mid circumflex followed by chronic total occlusion of the posterior AV groove branch of the PDA. 100% occluded small nondominant RCA. Successful DES stent to the mid Cx, but unable to cross the CTO of the distal vessel in the posterior AVGroove-LPDA  --Complex procedure due to the very difficult lesion to cross requiring buddy wire. Unable to cross despite using multiple different balloons. Prolonged procedure with prolonged imaging time.  Based on how long the procedure lasted, we will monitor the patient overnight. Would recommend outpatient echocardiogram to better assess change in wall motion.  Plan: Overnight observation Minimum of 1 year dual and platelet therapy (if taking NSAIDs, would not take aspirin) Continue aggressive rate medical management of distal Cx-PDA lesion.  Post-Intervention Diagram        Recent Labs: 10/08/2017: TSH 3.020 10/16/2017: Hemoglobin 13.3; Platelets 154 11/28/2017: ALT 39; BUN 14; Creatinine, Ser 0.82; Magnesium 2.1; Potassium 4.1; Sodium 138    Lipid Panel    Component Value Date/Time   CHOL 140 11/28/2017 1201   TRIG 124 11/28/2017 1201   HDL 64 11/28/2017 1201   CHOLHDL 2.2 11/28/2017 1201   LDLCALC 51 11/28/2017 1201     Wt Readings from Last 3 Encounters:  12/31/17 184 lb (83.5 kg)  11/28/17 183 lb (83 kg)  10/24/17 186 lb 6.4 oz (84.6 kg)     Other studies Reviewed: Additional studies/ records that were reviewed today include: Office notes, hospital records and testing.  ASSESSMENT AND PLAN:  1.  Paroxysmal atrial fibrillation: His heart rate is too low for him to be on a beta-blocker. -Continue  Xarelto. -He is reluctant to take the Xarelto long-term, he is having some gum bleeding when he flosses and he has had bleeding from small cuts and scrapes, it is more difficult to control now. -I advised him that I could not stop the Xarelto.  He has a follow-up appointment with Dr. Percival Spanish  coming up.  He will keep that and discuss long-term anticoagulation with him. -I mentioned to him the possibility of inserting a loop recorder to determine his A. fib burden, but he is reluctant to do this. -I explained that the risk of recurrent A. fib is high and he is unlikely to know if he gets it, unless he is in A. fib when he checks his blood pressure and he is going slow.  2.  Chronic anticoagulation: Continue Xarelto for now - CHA2DS2-VASc = 3 (age x 1, HTN, CAD)  3.  CAD: Continue aspirin, no Plavix because of the Xarelto.  He is on Crestor 20 mg, and valsartan/HCTZ.  No beta-blocker because of bradycardia.  4.  Hypertension: He is having some orthostatic dizziness that may be related to the HCTZ and his blood pressure is on the low side of normal which may also be causing some symptoms.  Decrease the Diovan-HCT from 320/25 down to 160-12.5 and he will continue to follow his blood pressure at home.  Current medicines are reviewed at length with the patient today.  The patient has concerns regarding medicines.  Concerns were addressed  The following changes have been made: Decrease valsartan/HCTZ  Labs/ tests ordered today include:  No orders of the defined types were placed in this encounter.    Disposition:   FU with Dr. Percival Spanish  Signed, [name], PA-C  12/31/2017 1:16 PM    [place] Phone: [phone]; Fax: [phone]  This note was written with the assistance of speech recognition software. Please excuse any transcriptional errors.

## 2018-01-01 DIAGNOSIS — S56494D Other injury of extensor muscle, fascia and tendon of left middle finger at forearm level, subsequent encounter: Secondary | ICD-10-CM | POA: Diagnosis not present

## 2018-01-01 DIAGNOSIS — S63283D Dislocation of proximal interphalangeal joint of left middle finger, subsequent encounter: Secondary | ICD-10-CM | POA: Diagnosis not present

## 2018-01-01 NOTE — Telephone Encounter (Signed)
Cardiac Rehab Medication Review by a Pharmacist  Does the patient  feel that his/her medications are working for him/her?  yes  Has the patient been experiencing any side effects to the medications prescribed?  Yes, felt a little lightheaded when he stands up sometimes, but according to the patient this was addressed by the PA lowering his BP meds  Does the patient measure his/her own blood pressure or blood glucose at home?  yes   Does the patient have any problems obtaining medications due to transportation or finances?   no  Understanding of regimen: good Understanding of indications: good Potential of compliance: good    Pharmacist comments: Patient understood his medications well, measures his BP 3x/day. Of note he says he gets his quinine for cramps in the [place].     Rush Barer 01/01/2018 4:02 PM

## 2018-01-03 ENCOUNTER — Encounter (HOSPITAL_COMMUNITY): Payer: Self-pay

## 2018-01-03 ENCOUNTER — Encounter (HOSPITAL_COMMUNITY)
Admission: RE | Admit: 2018-01-03 | Discharge: 2018-01-03 | Disposition: A | Discharge status: Home / Self Care | Payer: PPO | Source: Ambulatory Visit | Attending: Cardiology | Admitting: Cardiology

## 2018-01-03 VITALS — Ht 68.0 in | Wt 185.6 lb

## 2018-01-03 DIAGNOSIS — Z955 Presence of coronary angioplasty implant and graft: Secondary | ICD-10-CM | POA: Diagnosis not present

## 2018-01-03 NOTE — Progress Notes (Signed)
Cardiac Individual Treatment Plan  Patient Details  Name: Matthew Briggs MRN: [id number] Date of Birth: 10-28-1945 Referring Provider:     CARDIAC REHAB PHASE II ORIENTATION from 01/03/2018 in [place]  Referring Provider  Minus Breeding MD       Initial Encounter Date:    CARDIAC REHAB PHASE II ORIENTATION from 01/03/2018 in [place]  Date  01/03/18      Visit Diagnosis: Status post coronary artery stent placement  Patient's Home Medications on Admission:  Current Outpatient Medications:  .  acetaminophen (TYLENOL) 325 MG tablet, Take 2 tablets (650 mg total) by mouth every 6 (six) hours. (Patient not taking: Reported on 01/01/2018), Disp: , Rfl:  .  Calcium-Phosphorus-Vitamin D (CITRACAL +D3 PO), Take 1 tablet by mouth daily with supper., Disp: , Rfl:  .  cholecalciferol (VITAMIN D) 1000 UNITS tablet, Take 1,000 Units by mouth daily with supper. , Disp: , Rfl:  .  clopidogrel (PLAVIX) 75 MG tablet, Take 1 tablet (75 mg total) by mouth daily with breakfast. (Patient taking differently: Take 75 mg by mouth at bedtime. ), Disp: 90 tablet, Rfl: 3 .  Coenzyme Q10 (CO Q 10) 100 MG CAPS, Take 100 mg by mouth daily with supper. Qunol CoQ10, Disp: , Rfl:  .  diclofenac sodium (VOLTAREN) 1 % GEL, Apply 2-4 g topically 4 (four) times daily as needed (for arthritis pain)., Disp: , Rfl:  .  Krill Oil 500 MG CAPS, Take 500 mg by mouth daily with supper., Disp: , Rfl:  .  methocarbamol (ROBAXIN) 500 MG tablet, Take 1 tablet (500 mg total) by mouth every 8 (eight) hours as needed for muscle spasms. (Patient not taking: Reported on 01/01/2018), Disp: 15 tablet, Rfl: 0 .  Multiple Vitamin (MULTIVITAMIN WITH MINERALS) TABS tablet, Take 1 tablet by mouth daily with supper. , Disp: , Rfl:  .  Potassium 99 MG TABS, Take 99 mg by mouth daily with supper., Disp: , Rfl:  .  quiNINE (QUALAQUIN) 324 MG capsule, Take 648 mg by mouth daily as  needed (for cramps.). , Disp: , Rfl:  .  rivaroxaban (XARELTO) 20 MG TABS tablet, Take 1 tablet (20 mg total) by mouth daily with supper., Disp: 30 tablet, Rfl: 3 .  rosuvastatin (CRESTOR) 20 MG tablet, Take 1 tablet (20 mg total) by mouth at bedtime., Disp: 90 tablet, Rfl: 3 .  traMADol (ULTRAM) 50 MG tablet, Take 1 tablet (50 mg total) by mouth every 6 (six) hours as needed (mild pain). (Patient not taking: Reported on 01/01/2018), Disp: 20 tablet, Rfl: 0 .  valsartan-hydrochlorothiazide (DIOVAN HCT) 160-12.5 MG tablet, Take 1 tablet by mouth daily., Disp: 30 tablet, Rfl: 3  Past Medical History: Past Medical History:  Diagnosis Date  . Abnormal computed tomography angiography (CTA) 10/05/2017  . Abnormal MRI   . Arthritis    "hands" (10/15/2017)  . Coronary artery disease   . Coronary artery disease involving native heart 10/15/2017  . Dyspnea 02/02/2015  . Essential hypertension 02/02/2015  . High coronary artery calcium score 05/10/2015  . HTN (hypertension)   . Hyperlipemia   . Hyperlipidemia with target LDL less than 70 02/02/2015  . Hypertension   . Hypogonadism, male   . Irregular heart beat   . Laceration of chest wall 08/08/2017  . Medication management 10/05/2017  . Other fatigue 10/05/2017  . PVC's (premature ventricular contractions) 01/27/2017    Tobacco Use: Social History   Tobacco Use  Smoking Status Never Smoker  Smokeless Tobacco Never Used    Labs: Recent Review Flowsheet Data    Labs for ITP Cardiac and Pulmonary Rehab Latest Ref Rng & Units 08/08/2017 11/28/2017   Cholestrol 100 - 199 mg/dL - 140   LDLCALC 0 - 99 mg/dL - 51   HDL >39 mg/dL - 64   Trlycerides 0 - 149 mg/dL - 124   TCO2 22 - 32 mmol/L 24 -      Capillary Blood Glucose: No results found for: GLUCAP   Exercise Target Goals: Date: 01/03/18  Exercise Program Goal: Individual exercise prescription set using results from initial 6 min walk test and THRR while considering  patient's activity  barriers and safety.   Exercise Prescription Goal: Initial exercise prescription builds to 30-45 minutes a day of aerobic activity, 2-3 days per week.  Home exercise guidelines will be given to patient during program as part of exercise prescription that the participant will acknowledge.  Activity Barriers & Risk Stratification: Activity Barriers & Cardiac Risk Stratification - 01/03/18 1007      Activity Barriers & Cardiac Risk Stratification   Activity Barriers  Other (comment)    Comments  Right Hand Limitations, Left Leg Achilles Tendonitis, Right Leg Tendonitis, Occasional Hip Pain    Cardiac Risk Stratification  Moderate       6 Minute Walk: 6 Minute Walk    Row Name 01/03/18 1005         6 Minute Walk   Phase  Initial     Distance  1627 feet     Walk Time  6 minutes     # of Rest Breaks  0     MPH  3.08     METS  3.24     RPE  12     Perceived Dyspnea   0     VO2 Peak  11.4     Symptoms  No     Resting HR  58 bpm     Resting BP  120/70     Resting Oxygen Saturation   96 %     Exercise Oxygen Saturation  during 6 min walk  96 %     Max Ex. HR  80 bpm     Max Ex. BP  130/70     2 Minute Post BP  120/66        Oxygen Initial Assessment:   Oxygen Re-Evaluation:   Oxygen Discharge (Final Oxygen Re-Evaluation):   Initial Exercise Prescription: Initial Exercise Prescription - 01/03/18 1000      Date of Initial Exercise RX and Referring Provider   Date  01/03/18    Referring Provider  Minus Breeding MD     Expected Discharge Date  03/29/18      Recumbant Bike   Level  2    Watts  30    Minutes  10    METs  3.11      NuStep   Level  3    SPM  75    Minutes  10    METs  2.5      Track   Laps  12    Minutes  10    METs  3.07      Prescription Details   Frequency (times per week)  3x    Duration  Progress to 30 minutes of continuous aerobic without signs/symptoms of physical distress      Intensity   THRR 40-80% of Max Heartrate  60-120     Ratings of Perceived Exertion  11-13    Perceived Dyspnea  0-4      Progression   Progression  Continue progressive overload as per policy without signs/symptoms or physical distress.      Resistance Training   Training Prescription  Yes    Weight  4lbs    Reps  10-15       Perform Capillary Blood Glucose checks as needed.  Exercise Prescription Changes:   Exercise Comments:   Exercise Goals and Review: Exercise Goals    Row Name 01/03/18 1013             Exercise Goals   Increase Physical Activity  Yes       Intervention  Provide advice, education, support and counseling about physical activity/exercise needs.;Develop an individualized exercise prescription for aerobic and resistive training based on initial evaluation findings, risk stratification, comorbidities and participant's personal goals.       Expected Outcomes  Short Term: Attend rehab on a regular basis to increase amount of physical activity.;Long Term: Add in home exercise to make exercise part of routine and to increase amount of physical activity.;Long Term: Exercising regularly at least 3-5 days a week.       Increase Strength and Stamina  Yes       Intervention  Provide advice, education, support and counseling about physical activity/exercise needs.;Develop an individualized exercise prescription for aerobic and resistive training based on initial evaluation findings, risk stratification, comorbidities and participant's personal goals.       Expected Outcomes  Short Term: Increase workloads from initial exercise prescription for resistance, speed, and METs.;Short Term: Perform resistance training exercises routinely during rehab and add in resistance training at home;Long Term: Improve cardiorespiratory fitness, muscular endurance and strength as measured by increased METs and functional capacity (6MWT)       Able to understand and use rate of perceived exertion (RPE) scale  Yes       Intervention  Provide  education and explanation on how to use RPE scale       Expected Outcomes  Long Term:  Able to use RPE to guide intensity level when exercising independently;Short Term: Able to use RPE daily in rehab to express subjective intensity level       Knowledge and understanding of Target Heart Rate Range (THRR)  Yes       Intervention  Provide education and explanation of THRR including how the numbers were predicted and where they are located for reference       Expected Outcomes  Short Term: Able to state/look up THRR;Short Term: Able to use daily as guideline for intensity in rehab;Long Term: Able to use THRR to govern intensity when exercising independently       Able to check pulse independently  Yes       Intervention  Provide education and demonstration on how to check pulse in carotid and radial arteries.;Review the importance of being able to check your own pulse for safety during independent exercise       Expected Outcomes  Short Term: Able to explain why pulse checking is important during independent exercise;Long Term: Able to check pulse independently and accurately       Understanding of Exercise Prescription  Yes       Intervention  Provide education, explanation, and written materials on patient's individual exercise prescription       Expected Outcomes  Short Term: Able to explain program exercise prescription;Long Term:  Able to explain home exercise prescription to exercise independently          Exercise Goals Re-Evaluation :    Discharge Exercise Prescription (Final Exercise Prescription Changes):   Nutrition:  Target Goals: Understanding of nutrition guidelines, daily intake of sodium 1500mg , cholesterol 200mg , calories 30% from fat and 7% or less from saturated fats, daily to have 5 or more servings of fruits and vegetables.  Biometrics: Pre Biometrics - 01/03/18 1014      Pre Biometrics   Height  5\' 8"  (1.727 m)    Weight  84.2 kg    Waist Circumference  38.5 inches     Hip Circumference  40 inches    Waist to Hip Ratio  0.96 %    BMI (Calculated)  28.23    Triceps Skinfold  27 mm    % Body Fat  29.5 %    Grip Strength  42 kg    Flexibility  15 in    Single Leg Stand  30 seconds        Nutrition Therapy Plan and Nutrition Goals: Nutrition Therapy & Goals - 01/03/18 0849      Nutrition Therapy   Diet  consistent carbohydrate heart healthy      Personal Nutrition Goals   Nutrition Goal  Pt to identify food quantities necessary to achieve weight loss of 6-24 lbs. at graduation from cardiac rehab    Personal Goal #2  Pt to identify and limit food sources of saturated fat, trans fat, sodium, refined carbohydrates    Personal Goal #3  Pt to decrease frequency of eating out      Intervention Plan   Intervention  Prescribe, educate and counsel regarding individualized specific dietary modifications aiming towards targeted core components such as weight, hypertension, lipid management, diabetes, heart failure and other comorbidities.    Expected Outcomes  Short Term Goal: Understand basic principles of dietary content, such as calories, fat, sodium, cholesterol and nutrients.       Nutrition Assessments: Nutrition Assessments - 01/03/18 0900      MEDFICTS Scores   Pre Score  --       Nutrition Goals Re-Evaluation: Nutrition Goals Re-Evaluation    [place] Name 01/03/18 0849             Goals   Current Weight  185 lb 10 oz (84.2 kg)          Nutrition Goals Re-Evaluation: Nutrition Goals Re-Evaluation    [place] Name 01/03/18 0849             Goals   Current Weight  185 lb 10 oz (84.2 kg)          Nutrition Goals Discharge (Final Nutrition Goals Re-Evaluation): Nutrition Goals Re-Evaluation - 01/03/18 0849      Goals   Current Weight  185 lb 10 oz (84.2 kg)       Psychosocial: Target Goals: Acknowledge presence or absence of significant depression and/or stress, maximize coping skills, provide positive support system.  Participant is able to verbalize types and ability to use techniques and skills needed for reducing stress and depression.  Initial Review & Psychosocial Screening: Initial Psych Review & Screening - 01/03/18 0958      Initial Review   Current issues with  None Identified      Family Dynamics   Good Support System?  Yes      Barriers   Psychosocial barriers to participate in program  There are no identifiable barriers or  psychosocial needs.      Screening Interventions   Interventions  Encouraged to exercise       Quality of Life Scores: Quality of Life - 01/03/18 0957      Quality of Life   Select  Quality of Life      Quality of Life Scores   Health/Function Pre  27.4 %    Socioeconomic Pre  25.57 %    Psych/Spiritual Pre  26.36 %    Family Pre  30 %    GLOBAL Pre  27.19 %      Scores of 19 and below usually indicate a poorer quality of life in these areas.  A difference of  2-3 points is a clinically meaningful difference.  A difference of 2-3 points in the total score of the Quality of Life Index has been associated with significant improvement in overall quality of life, self-image, physical symptoms, and general health in studies assessing change in quality of life.  PHQ-9: Recent Review Flowsheet Data    There is no flowsheet data to display.     Interpretation of Total Score  Total Score Depression Severity:  1-4 = Minimal depression, 5-9 = Mild depression, 10-14 = Moderate depression, 15-19 = Moderately severe depression, 20-27 = Severe depression   Psychosocial Evaluation and Intervention:   Psychosocial Re-Evaluation:   Psychosocial Discharge (Final Psychosocial Re-Evaluation):   Vocational Rehabilitation: Provide vocational rehab assistance to qualifying candidates.   Vocational Rehab Evaluation & Intervention: Vocational Rehab - 01/03/18 0957      Initial Vocational Rehab Evaluation & Intervention   Assessment shows need for Vocational  Rehabilitation  No       Education: Education Goals: Education classes will be provided on a weekly basis, covering required topics. Participant will state understanding/return demonstration of topics presented.  Learning Barriers/Preferences: Learning Barriers/Preferences - 01/03/18 1015      Learning Barriers/Preferences   Learning Barriers  None    Learning Preferences  Skilled Demonstration;Pictoral       Education Topics: Count Your Pulse:  -Group instruction provided by verbal instruction, demonstration, patient participation and written materials to support subject.  Instructors address importance of being able to find your pulse and how to count your pulse when at home without a heart monitor.  Patients get hands on experience counting their pulse with staff help and individually.   Heart Attack, Angina, and Risk Factor Modification:  -Group instruction provided by verbal instruction, video, and written materials to support subject.  Instructors address signs and symptoms of angina and heart attacks.    Also discuss risk factors for heart disease and how to make changes to improve heart health risk factors.   Functional Fitness:  -Group instruction provided by verbal instruction, demonstration, patient participation, and written materials to support subject.  Instructors address safety measures for doing things around the house.  Discuss how to get up and down off the floor, how to pick things up properly, how to safely get out of a chair without assistance, and balance training.   Meditation and Mindfulness:  -Group instruction provided by verbal instruction, patient participation, and written materials to support subject.  Instructor addresses importance of mindfulness and meditation practice to help reduce stress and improve awareness.  Instructor also leads participants through a meditation exercise.    Stretching for Flexibility and Mobility:  -Group instruction  provided by verbal instruction, patient participation, and written materials to support subject.  Instructors lead participants through series of stretches that are designed  to increase flexibility thus improving mobility.  These stretches are additional exercise for major muscle groups that are typically performed during regular warm up and cool down.   Hands Only CPR:  -Group verbal, video, and participation provides a basic overview of AHA guidelines for community CPR. Role-play of emergencies allow participants the opportunity to practice calling for help and chest compression technique with discussion of AED use.   Hypertension: -Group verbal and written instruction that provides a basic overview of hypertension including the most recent diagnostic guidelines, risk factor reduction with self-care instructions and medication management.    Nutrition I class: Heart Healthy Eating:  -Group instruction provided by PowerPoint slides, verbal discussion, and written materials to support subject matter. The instructor gives an explanation and review of the Therapeutic Lifestyle Changes diet recommendations, which includes a discussion on lipid goals, dietary fat, sodium, fiber, plant stanol/sterol esters, sugar, and the components of a well-balanced, healthy diet.   Nutrition II class: Lifestyle Skills:  -Group instruction provided by PowerPoint slides, verbal discussion, and written materials to support subject matter. The instructor gives an explanation and review of label reading, grocery shopping for heart health, heart healthy recipe modifications, and ways to make healthier choices when eating out.   Diabetes Question & Answer:  -Group instruction provided by PowerPoint slides, verbal discussion, and written materials to support subject matter. The instructor gives an explanation and review of diabetes co-morbidities, pre- and post-prandial blood glucose goals, pre-exercise blood glucose  goals, signs, symptoms, and treatment of hypoglycemia and hyperglycemia, and foot care basics.   Diabetes Blitz:  -Group instruction provided by PowerPoint slides, verbal discussion, and written materials to support subject matter. The instructor gives an explanation and review of the physiology behind type 1 and type 2 diabetes, diabetes medications and rational behind using different medications, pre- and post-prandial blood glucose recommendations and Hemoglobin A1c goals, diabetes diet, and exercise including blood glucose guidelines for exercising safely.    Portion Distortion:  -Group instruction provided by PowerPoint slides, verbal discussion, written materials, and food models to support subject matter. The instructor gives an explanation of serving size versus portion size, changes in portions sizes over the last 20 years, and what consists of a serving from each food group.   Stress Management:  -Group instruction provided by verbal instruction, video, and written materials to support subject matter.  Instructors review role of stress in heart disease and how to cope with stress positively.     Exercising on Your Own:  -Group instruction provided by verbal instruction, power point, and written materials to support subject.  Instructors discuss benefits of exercise, components of exercise, frequency and intensity of exercise, and end points for exercise.  Also discuss use of nitroglycerin and activating EMS.  Review options of places to exercise outside of rehab.  Review guidelines for sex with heart disease.   Cardiac Drugs I:  -Group instruction provided by verbal instruction and written materials to support subject.  Instructor reviews cardiac drug classes: antiplatelets, anticoagulants, beta blockers, and statins.  Instructor discusses reasons, side effects, and lifestyle considerations for each drug class.   Cardiac Drugs II:  -Group instruction provided by verbal instruction  and written materials to support subject.  Instructor reviews cardiac drug classes: angiotensin converting enzyme inhibitors (ACE-I), angiotensin II receptor blockers (ARBs), nitrates, and calcium channel blockers.  Instructor discusses reasons, side effects, and lifestyle considerations for each drug class.   Anatomy and Physiology of the Circulatory System:  Group verbal and written  instruction and models provide basic cardiac anatomy and physiology, with the coronary electrical and arterial systems. Review of: AMI, Angina, Valve disease, Heart Failure, Peripheral Artery Disease, Cardiac Arrhythmia, Pacemakers, and the ICD.   Other Education:  -Group or individual verbal, written, or video instructions that support the educational goals of the cardiac rehab program.   Holiday Eating Survival Tips:  -Group instruction provided by PowerPoint slides, verbal discussion, and written materials to support subject matter. The instructor gives patients tips, tricks, and techniques to help them not only survive but enjoy the holidays despite the onslaught of food that accompanies the holidays.   Knowledge Questionnaire Score: Knowledge Questionnaire Score - 01/03/18 0957      Knowledge Questionnaire Score   Pre Score  22/24       Core Components/Risk Factors/Patient Goals at Admission: Personal Goals and Risk Factors at Admission - 01/03/18 1017      Core Components/Risk Factors/Patient Goals on Admission    Weight Management  Yes;Weight Loss    Intervention  Weight Management: Develop a combined nutrition and exercise program designed to reach desired caloric intake, while maintaining appropriate intake of nutrient and fiber, sodium and fats, and appropriate energy expenditure required for the weight goal.;Weight Management: Provide education and appropriate resources to help participant work on and attain dietary goals.;Weight Management/Obesity: Establish reasonable short term and long term  weight goals.;Obesity: Provide education and appropriate resources to help participant work on and attain dietary goals.    Admit Weight  185 lb 10 oz (84.2 kg)    Goal Weight: Short Term  180 lb (81.6 kg)    Goal Weight: Long Term  175 lb (79.4 kg)    Expected Outcomes  Short Term: Continue to assess and modify interventions until short term weight is achieved;Long Term: Adherence to nutrition and physical activity/exercise program aimed toward attainment of established weight goal;Weight Loss: Understanding of general recommendations for a balanced deficit meal plan, which promotes 1-2 lb weight loss per week and includes a negative energy balance of [phone] kcal/d;Understanding recommendations for meals to include 15-35% energy as protein, 25-35% energy from fat, 35-60% energy from carbohydrates, less than 200mg  of dietary cholesterol, 20-35 gm of total fiber daily;Understanding of distribution of calorie intake throughout the day with the consumption of 4-5 meals/snacks    Hypertension  Yes    Intervention  Provide education on lifestyle modifcations including regular physical activity/exercise, weight management, moderate sodium restriction and increased consumption of fresh fruit, vegetables, and low fat dairy, alcohol moderation, and smoking cessation.;Monitor prescription use compliance.    Expected Outcomes  Short Term: Continued assessment and intervention until BP is < 140/79mm HG in hypertensive participants. < 130/81mm HG in hypertensive participants with diabetes, heart failure or chronic kidney disease.;Long Term: Maintenance of blood pressure at goal levels.    Lipids  Yes    Intervention  Provide education and support for participant on nutrition & aerobic/resistive exercise along with prescribed medications to achieve LDL 70mg , HDL >40mg .    Expected Outcomes  Short Term: Participant states understanding of desired cholesterol values and is compliant with medications prescribed.  Participant is following exercise prescription and nutrition guidelines.;Long Term: Cholesterol controlled with medications as prescribed, with individualized exercise RX and with personalized nutrition plan. Value goals: LDL < 70mg , HDL > 40 mg.    Stress  Yes    Intervention  Offer individual and/or small group education and counseling on adjustment to heart disease, stress management and health-related lifestyle change. Teach and support  self-help strategies.;Refer participants experiencing significant psychosocial distress to appropriate mental health specialists for further evaluation and treatment. When possible, include family members and significant others in education/counseling sessions.    Expected Outcomes  Short Term: Participant demonstrates changes in health-related behavior, relaxation and other stress management skills, ability to obtain effective social support, and compliance with psychotropic medications if prescribed.;Long Term: Emotional wellbeing is indicated by absence of clinically significant psychosocial distress or social isolation.       Core Components/Risk Factors/Patient Goals Review:    Core Components/Risk Factors/Patient Goals at Discharge (Final Review):    ITP Comments: ITP Comments    Row Name 01/03/18 0752           ITP Comments  Dr. Fransico Him, Medical Director           Comments: Patient attended orientation from [phone] to [phone] to review rules and guidelines for program. Completed 6 minute walk test, Intitial ITP, and exercise prescription.  VSS. Telemetry-SB/SR.  Asymptomatic.

## 2018-01-03 NOTE — Progress Notes (Addendum)
Matthew Briggs 71 y.o. male DOB: Sep 05, 1945 MRN: [id number]      Nutrition Note  1. Status post coronary artery stent placement    Past Medical History:  Diagnosis Date  . Abnormal computed tomography angiography (CTA) 10/05/2017  . Abnormal MRI   . Arthritis    "hands" (10/15/2017)  . Coronary artery disease   . Coronary artery disease involving native heart 10/15/2017  . Dyspnea 02/02/2015  . Essential hypertension 02/02/2015  . High coronary artery calcium score 05/10/2015  . HTN (hypertension)   . Hyperlipemia   . Hyperlipidemia with target LDL less than 70 02/02/2015  . Hypertension   . Hypogonadism, male   . Irregular heart beat   . Laceration of chest wall 08/08/2017  . Medication management 10/05/2017  . Other fatigue 10/05/2017  . PVC's (premature ventricular contractions) 01/27/2017   Meds reviewed. Vit D, calcium, rosuvastatin  noted  HT: Ht Readings from Last 1 Encounters:  01/03/18 5\' 8"  (1.727 m)    WT: Wt Readings from Last 5 Encounters:  01/03/18 185 lb 10 oz (84.2 kg)  12/31/17 184 lb (83.5 kg)  11/28/17 183 lb (83 kg)  10/24/17 186 lb 6.4 oz (84.6 kg)  10/16/17 187 lb 6.3 oz (85 kg)     Body mass index is 28.22 kg/m.   Current tobacco use? no     Labs:  Lipid Panel     Component Value Date/Time   CHOL 140 11/28/2017 1201   TRIG 124 11/28/2017 1201   HDL 64 11/28/2017 1201   CHOLHDL 2.2 11/28/2017 1201   LDLCALC 51 11/28/2017 1201    No results found for: HGBA1C CBG (last 3)  No results for input(s): GLUCAP in the last 72 hours.  Nutrition Note Spoke with pt. Nutrition plan and goals reviewed with pt. Pt did not fill out MEDFICTS survey, requested pt complete survey and return first day of rehab. Pt wants to lose wt. Pt has not been trying to lose wt. Wt loss tips reviewed. Recommended using measuring spoons and cups to ensure accuracy of portion size, food journaling, and eating regularly across the day. Per discussion, pt eats out frequently,  frequently skips meals. Recommended pt decrease number of meals eaten out to reduce trans, saturated fats, and sodium.  Pt was resistant to dietitian recommendations at this time. Will revisit at a later date.  Pt reports PCP telling him his blood sugar was elevated and he has pre-diabetes. Don't have recent A1c, requested pt bring in to RN or RD at next visit.  Pt expressed understanding of the information reviewed. Pt aware of nutrition education classes offered and is unable to attend nutrition classes due to conflict with work.  Nutrition Diagnosis ? Food-and nutrition-related knowledge deficit related to lack of exposure to information as related to diagnosis of: ? CVD ? Pre-DM ? Overweight related to excessive energy intake as evidenced by a Body mass index is 28.22 kg/m.  Nutrition Intervention ? Pt's individual nutrition plan and goals reviewed with pt. ? Pt given handouts for: ? Nutrition I class ? Nutrition II class  ? Diabetes Blitz Class ? Diabetes Q & A class  ? pre-diabetes  Nutrition Goal(s):   ? Pt to identify and limit food sources of saturated fat, trans fat, sodium, refined carbohydrates ? Pt to identify food quantities necessary to achieve weight loss of 6-24 lbs. at graduation from cardiac rehab. Goal wt loss of 10-15 lb desired.  ? Pt to decrease frequency of eating out  Plan:  ? Pt to attend nutrition classes ? Nutrition I ? Nutrition II ? Portion Distortion  ? Diabetes Blitz ? Diabetes Q & Ae determined ? Will provide client-centered nutrition education as part of interdisciplinary care ? Monitor and evaluate progress toward nutrition goal with team.   Laurina Bustle, MS, RD, LDN 01/03/2018 8:49 AM

## 2018-01-04 DIAGNOSIS — S56494D Other injury of extensor muscle, fascia and tendon of left middle finger at forearm level, subsequent encounter: Secondary | ICD-10-CM | POA: Diagnosis not present

## 2018-01-04 DIAGNOSIS — S63283D Dislocation of proximal interphalangeal joint of left middle finger, subsequent encounter: Secondary | ICD-10-CM | POA: Diagnosis not present

## 2018-01-05 ENCOUNTER — Encounter: Payer: Self-pay | Admitting: Cardiology

## 2018-01-05 NOTE — Progress Notes (Signed)
Cardiology Office Note   Date:  01/07/2018   ID:  Matthew Briggs, DOB 1945/06/14, MRN [id number]  PCP:  Marton Redwood, MD  Cardiologist:   Minus Breeding, MD   Chief Complaint  Patient presents with  . Coronary Artery Disease     History of Present Illness: Matthew Briggs is a 72 y.o. male who presents for evaluation follow up of calcium score that was greater than 800 and 89% for his age.  He had a stress perfusion study in 2016 that demonstrated a fixed defect in the apical region.  The study was not gated secondary to PVCs. He had a POET (Plain Old Exercise Treadmill) in Jan 2018 that demonstrated no ischemia.  He had another this January and there were increased PVCs.  I sent him for a CTA.  This suggested severe stenosis and subtotal occlusion of the mid circumflex with probably mild disease in the LAD.   By [facility] there appeared to be hemodynamically significant first diagonal stenosis.  He had cath in May and was found to have severe 2 vessel disease and he had a drug eluding stent to the circ.   He subsequently was found to have atrial fib.  He did have an echo which demonstrated a normal EF.  He has been on blood thinner.    Since I last saw him he has done well.  The patient denies any new symptoms such as chest discomfort, neck or arm discomfort. There has been no new shortness of breath, PND or orthopnea. There have been no reported palpitations, presyncope or syncope.  He is starting cardiac rehab.      Past Medical History:  Diagnosis Date  . Abnormal computed tomography angiography (CTA) 10/05/2017  . Abnormal MRI   . Arthritis    "hands" (10/15/2017)  . Atrial fibrillation ([place])   . Coronary artery disease involving native heart 10/15/2017  . Essential hypertension 02/02/2015  . Hyperlipidemia with target LDL less than 70 02/02/2015  . Hypogonadism, male   . Laceration of chest wall 08/08/2017  . PVC's (premature ventricular contractions) 01/27/2017    Past  Surgical History:  Procedure Laterality Date  . CARPAL TUNNEL RELEASE Left 04/2017  . COLONOSCOPY    . CORONARY STENT INTERVENTION N/A 10/15/2017   Procedure: CORONARY STENT INTERVENTION;  Surgeon: Leonie Man, MD;  Location: [place] [facility];  Service: Cardiovascular;  Laterality: N/A;  . FRACTURE SURGERY    . INGUINAL HERNIA REPAIR Right ~ 1970  . IRRIGATION AND DEBRIDEMENT ABSCESS  08/08/2017   Procedure: IRRIGATION AND DEBRIDEMENT OF CHEST;  Surgeon: Judeth Horn, MD;  Location: [place];  Service: General;;  . LEFT HEART CATH AND CORONARY ANGIOGRAPHY N/A 10/15/2017   Procedure: LEFT HEART CATH AND CORONARY ANGIOGRAPHY;  Surgeon: Leonie Man, MD;  Location: [place] [facility];  Service: Cardiovascular;  Laterality: N/A;  . MINOR IRRIGATION AND DEBRIDEMENT OF WOUND Left 08/08/2017   Procedure: CLOSED REDUCTION REPAIR OF PIP JOINT/LACERATION LEFT MIDDLE FINGER;  Surgeon: Milly Jakob, MD;  Location: [place];  Service: Orthopedics;  Laterality: Left;  . OPEN REDUCTION INTERNAL FIXATION (ORIF) DISTAL PHALANX Left 08/13/2017   Procedure: OPEN TREATMENT OF LEFT LONG FINGER OPEN JOINT DISLOCATION OF PROXIMAL PHALANYX WITH EXTENSOR TENDON REPAIR;  Surgeon: Milly Jakob, MD;  Location: [place];  Service: Orthopedics;  Laterality: Left;  . REPAIR EXTENSOR TENDON Left 08/13/2017   Procedure: REPAIR EXTENSOR TENDON;  Surgeon: Milly Jakob, MD;  Location: [facility];  Service: Orthopedics;  Laterality: Left;     Current Outpatient Medications  Medication Sig Dispense Refill  . acetaminophen (TYLENOL) 325 MG tablet Take 2 tablets (650 mg total) by mouth every 6 (six) hours.    . Calcium-Phosphorus-Vitamin D (CITRACAL +D3 PO) Take 1 tablet by mouth daily with supper.    . cholecalciferol (VITAMIN D) 1000 UNITS tablet Take 1,000 Units by mouth daily with supper.     . clopidogrel (PLAVIX) 75 MG tablet Take 1 tablet (75 mg total) by mouth daily with breakfast.  (Patient taking differently: Take 75 mg by mouth at bedtime. ) 90 tablet 3  . Coenzyme Q10 (CO Q 10) 100 MG CAPS Take 100 mg by mouth daily with supper. Qunol CoQ10    . diclofenac sodium (VOLTAREN) 1 % GEL Apply 2-4 g topically 4 (four) times daily as needed (for arthritis pain).    Javier Docker Oil 500 MG CAPS Take 500 mg by mouth daily with supper.    . methocarbamol (ROBAXIN) 500 MG tablet Take 1 tablet (500 mg total) by mouth every 8 (eight) hours as needed for muscle spasms. 15 tablet 0  . Multiple Vitamin (MULTIVITAMIN WITH MINERALS) TABS tablet Take 1 tablet by mouth daily with supper.     . Potassium 99 MG TABS Take 99 mg by mouth daily with supper.    . quiNINE (QUALAQUIN) 324 MG capsule Take 648 mg by mouth daily as needed (for cramps.).     Marland Kitchen rivaroxaban (XARELTO) 20 MG TABS tablet Take 1 tablet (20 mg total) by mouth daily with supper. 30 tablet 3  . rosuvastatin (CRESTOR) 20 MG tablet Take 1 tablet (20 mg total) by mouth at bedtime. 90 tablet 3  . traMADol (ULTRAM) 50 MG tablet Take 1 tablet (50 mg total) by mouth every 6 (six) hours as needed (mild pain). 20 tablet 0  . valsartan-hydrochlorothiazide (DIOVAN HCT) 160-12.5 MG tablet Take 1 tablet by mouth daily. 30 tablet 3   No current facility-administered medications for this visit.     Allergies:   Patient has no known allergies.    ROS:  Please see the history of present illness.   Otherwise, review of systems are positive for none.   All other systems are reviewed and negative.    PHYSICAL EXAM: VS:  BP (!) 153/84   Pulse 64   Ht 5\' 8"  (1.727 m)   Wt 185 lb 6.4 oz (84.1 kg)   SpO2 96%   BMI 28.19 kg/m  , BMI Body mass index is 28.19 kg/m.  GENERAL:  Well appearing NECK:  No jugular venous distention, waveform within normal limits, carotid upstroke brisk and symmetric, no bruits, no thyromegaly LUNGS:  Clear to auscultation bilaterally CHEST:  Unremarkable HEART:  PMI not displaced or sustained,S1 and S2 within normal  limits, no S3, no S4, no clicks, no rubs, no murmurs ABD:  Flat, positive bowel sounds normal in frequency in pitch, no bruits, no rebound, no guarding, no midline pulsatile mass, no hepatomegaly, no splenomegaly EXT:  2 plus pulses throughout, no edema, no cyanosis no clubbing   EKG:  EKG is not ordered today.    Recent Labs: 10/08/2017: TSH 3.020 10/16/2017: Hemoglobin 13.3; Platelets 154 11/28/2017: ALT 39; BUN 14; Creatinine, Ser 0.82; Magnesium 2.1; Potassium 4.1; Sodium 138    Lipid Panel    Component Value Date/Time   CHOL 140 11/28/2017 1201   TRIG 124 11/28/2017 1201   HDL 64 11/28/2017 1201  CHOLHDL 2.2 11/28/2017 1201   LDLCALC 51 11/28/2017 1201      Wt Readings from Last 3 Encounters:  01/07/18 185 lb 6.4 oz (84.1 kg)  01/03/18 185 lb 10 oz (84.2 kg)  12/31/17 184 lb (83.5 kg)      Other studies Reviewed: Additional studies/ records that were reviewed today include:    Cath and CT images Review of the above records demonstrates:      ASSESSMENT AND PLAN:  CAD:    He has had PCI as above.   We long discussion about his anticoagulant regimen.  I am to keep him on the Plavix for total of 6 months we will see him in November and probably discontinue this.  ATRIAL FIB:  Matthew Briggs has a CHA2DS2 - VASc score of 3.  He will remain on Xarelto.  Otherwise no change in therapy.  He is not having any symptomatic paroxysms.  CAD: We talked about risk reduction.  For him I think stress is 1 of his unmanaged risk factors and we discussed this for quite a while.Marland Kitchen    PULMONARY NODULE:    I will follow this up in April of next year.    PVCs:   He does not feel this.  No change in therapy.    HTN:   He had some orthostasis and had his Diovan/Hct dose reduced.  He will remain on the meds as listed.  No change in therapy.   Current medicines are reviewed at length with the patient today.  The patient does not have concerns regarding medicines  The following  changes have been made:  None  Labs/ tests ordered today include:   None No orders of the defined types were placed in this encounter.    Disposition:   FU with me in Nov.   Signed, Minus Breeding, MD  01/07/2018 12:51 PM    [place]

## 2018-01-07 ENCOUNTER — Ambulatory Visit: Payer: PPO | Admitting: Cardiology

## 2018-01-07 ENCOUNTER — Encounter (HOSPITAL_COMMUNITY)
Admission: RE | Admit: 2018-01-07 | Discharge: 2018-01-07 | Disposition: A | Discharge status: Home / Self Care | Payer: PPO | Source: Ambulatory Visit | Attending: Cardiology | Admitting: Cardiology

## 2018-01-07 ENCOUNTER — Encounter (HOSPITAL_COMMUNITY): Payer: PPO

## 2018-01-07 ENCOUNTER — Encounter (HOSPITAL_COMMUNITY): Payer: Self-pay

## 2018-01-07 ENCOUNTER — Encounter: Payer: Self-pay | Admitting: Cardiology

## 2018-01-07 VITALS — BP 153/84 | HR 64 | Ht 68.0 in | Wt 185.4 lb

## 2018-01-07 DIAGNOSIS — I480 Paroxysmal atrial fibrillation: Secondary | ICD-10-CM | POA: Diagnosis not present

## 2018-01-07 DIAGNOSIS — I2510 Atherosclerotic heart disease of native coronary artery without angina pectoris: Secondary | ICD-10-CM

## 2018-01-07 DIAGNOSIS — I10 Essential (primary) hypertension: Secondary | ICD-10-CM | POA: Diagnosis not present

## 2018-01-07 DIAGNOSIS — Z955 Presence of coronary angioplasty implant and graft: Secondary | ICD-10-CM | POA: Diagnosis not present

## 2018-01-07 NOTE — Progress Notes (Addendum)
Daily Session Note  Patient Details  Name: Matthew Briggs MRN: [id number] Date of Birth: [birth year] Referring Provider:   Flowsheet Row CARDIAC REHAB PHASE II ORIENTATION from 01/03/2018 in [place]  Referring Provider  Minus Breeding MD       Encounter Date: 01/07/2018  Check In: Session Check In - 01/07/18 0716    Check-In          Supervising physician immediately available to respond to emergencies  [facility] immediately available    Physician(s)  Dr. Florene Glen    Location  [facility]    Staff Present  Dorma Russell, MS,ACSM CEP, Exercise Physiologist;Cristal Howatt, RN, Deland Pretty, MS, ACSM CEP, Exercise Physiologist;Molly DiVincenzo, MS, ACSM RCEP, Exercise Physiologist    Medication changes reported      No    Fall or balance concerns reported     No    Tobacco Cessation  No Change    Warm-up and Cool-down  Performed as group-led instruction    Resistance Training Performed  Yes    VAD Patient?  No    PAD/SET Patient?  No        Pain Assessment          Currently in Pain?  No/denies    Multiple Pain Sites  No           Capillary Blood Glucose: No results found for this or any previous visit (from the past 24 hour(s)).    Social History   Tobacco Use  Smoking Status Never Smoker  Smokeless Tobacco Never Used    Goals Met:  Exercise tolerated well  Goals Unmet:  Not Applicable  Comments: Pt started cardiac rehab today.  Pt tolerated light exercise without difficulty. VSS, telemetry-sinus rhythm, bigeminal PVC, asymptomatic.  Rhythm strips sent with pt to Dr. Tana Coast for review.  Medication list reconciled. Pt denies barriers to medicaiton compliance.  PSYCHOSOCIAL ASSESSMENT:  PHQ- 0. Pt exhibits positive coping skills, hopeful outlook with supportive family. No psychosocial needs identified at this time, no psychosocial interventions necessary.     Pt oriented to exercise equipment and  routine.    Understanding verbalized.  Andi Hence, RN, BSN Cardiac Pulmonary Rehab     Dr. Fransico Him is Medical Director for Cardiac Rehab at [facility].

## 2018-01-07 NOTE — Patient Instructions (Signed)
Medication Instructions:  Continue current medications  If you need a refill on your cardiac medications before your next appointment, please call your pharmacy.  Labwork: None Ordered   Testing/Procedures: None Ordered   Follow-Up: Your physician wants you to follow-up in: 3 Months.     Thank you for choosing [facility] at [facility]!!

## 2018-01-09 ENCOUNTER — Encounter (HOSPITAL_COMMUNITY)
Admission: RE | Admit: 2018-01-09 | Discharge: 2018-01-09 | Disposition: A | Discharge status: Home / Self Care | Payer: PPO | Source: Ambulatory Visit | Attending: Cardiology | Admitting: Cardiology

## 2018-01-09 ENCOUNTER — Encounter (HOSPITAL_COMMUNITY): Payer: PPO

## 2018-01-09 DIAGNOSIS — Z955 Presence of coronary angioplasty implant and graft: Secondary | ICD-10-CM | POA: Diagnosis not present

## 2018-01-09 NOTE — Addendum Note (Signed)
Addended by: Therisa Doyne on: 01/09/2018 10:58 AM   Modules accepted: Orders

## 2018-01-10 DIAGNOSIS — S56494D Other injury of extensor muscle, fascia and tendon of left middle finger at forearm level, subsequent encounter: Secondary | ICD-10-CM | POA: Diagnosis not present

## 2018-01-10 DIAGNOSIS — S63283D Dislocation of proximal interphalangeal joint of left middle finger, subsequent encounter: Secondary | ICD-10-CM | POA: Diagnosis not present

## 2018-01-11 ENCOUNTER — Encounter (HOSPITAL_COMMUNITY): Payer: PPO

## 2018-01-11 ENCOUNTER — Encounter (HOSPITAL_COMMUNITY)
Admission: RE | Admit: 2018-01-11 | Discharge: 2018-01-11 | Disposition: A | Discharge status: Home / Self Care | Payer: PPO | Source: Ambulatory Visit | Attending: Cardiology | Admitting: Cardiology

## 2018-01-11 DIAGNOSIS — Z955 Presence of coronary angioplasty implant and graft: Secondary | ICD-10-CM | POA: Diagnosis not present

## 2018-01-14 ENCOUNTER — Encounter (HOSPITAL_COMMUNITY)
Admission: RE | Admit: 2018-01-14 | Discharge: 2018-01-14 | Disposition: A | Discharge status: Home / Self Care | Payer: PPO | Source: Ambulatory Visit | Attending: Cardiology | Admitting: Cardiology

## 2018-01-14 ENCOUNTER — Encounter (HOSPITAL_COMMUNITY): Payer: PPO

## 2018-01-14 DIAGNOSIS — S63283D Dislocation of proximal interphalangeal joint of left middle finger, subsequent encounter: Secondary | ICD-10-CM | POA: Diagnosis not present

## 2018-01-14 DIAGNOSIS — S56494D Other injury of extensor muscle, fascia and tendon of left middle finger at forearm level, subsequent encounter: Secondary | ICD-10-CM | POA: Diagnosis not present

## 2018-01-14 DIAGNOSIS — Z955 Presence of coronary angioplasty implant and graft: Secondary | ICD-10-CM | POA: Diagnosis not present

## 2018-01-16 ENCOUNTER — Encounter (HOSPITAL_COMMUNITY)
Admission: RE | Admit: 2018-01-16 | Discharge: 2018-01-16 | Disposition: A | Discharge status: Home / Self Care | Payer: PPO | Source: Ambulatory Visit | Attending: Cardiology | Admitting: Cardiology

## 2018-01-16 ENCOUNTER — Encounter (HOSPITAL_COMMUNITY): Payer: PPO

## 2018-01-16 DIAGNOSIS — Z955 Presence of coronary angioplasty implant and graft: Secondary | ICD-10-CM

## 2018-01-18 ENCOUNTER — Encounter (HOSPITAL_COMMUNITY): Payer: PPO

## 2018-01-21 ENCOUNTER — Encounter (HOSPITAL_COMMUNITY): Payer: PPO

## 2018-01-21 ENCOUNTER — Encounter (HOSPITAL_COMMUNITY)
Admission: RE | Admit: 2018-01-21 | Discharge: 2018-01-21 | Disposition: A | Discharge status: Home / Self Care | Payer: PPO | Source: Ambulatory Visit | Attending: Cardiology | Admitting: Cardiology

## 2018-01-23 ENCOUNTER — Encounter (HOSPITAL_COMMUNITY): Payer: PPO

## 2018-01-23 DIAGNOSIS — M1811 Unilateral primary osteoarthritis of first carpometacarpal joint, right hand: Secondary | ICD-10-CM | POA: Diagnosis not present

## 2018-01-23 DIAGNOSIS — M1812 Unilateral primary osteoarthritis of first carpometacarpal joint, left hand: Secondary | ICD-10-CM | POA: Diagnosis not present

## 2018-01-24 ENCOUNTER — Ambulatory Visit: Payer: PPO | Admitting: Cardiology

## 2018-01-25 ENCOUNTER — Encounter (HOSPITAL_COMMUNITY): Payer: Self-pay

## 2018-01-25 ENCOUNTER — Encounter (HOSPITAL_COMMUNITY)
Admission: RE | Admit: 2018-01-25 | Discharge: 2018-01-25 | Disposition: A | Discharge status: Home / Self Care | Payer: PPO | Source: Ambulatory Visit | Attending: Cardiology | Admitting: Cardiology

## 2018-01-25 ENCOUNTER — Encounter (HOSPITAL_COMMUNITY): Payer: PPO

## 2018-01-25 DIAGNOSIS — Z955 Presence of coronary angioplasty implant and graft: Secondary | ICD-10-CM | POA: Diagnosis not present

## 2018-01-25 DIAGNOSIS — S56494D Other injury of extensor muscle, fascia and tendon of left middle finger at forearm level, subsequent encounter: Secondary | ICD-10-CM | POA: Diagnosis not present

## 2018-01-25 DIAGNOSIS — S63283D Dislocation of proximal interphalangeal joint of left middle finger, subsequent encounter: Secondary | ICD-10-CM | POA: Diagnosis not present

## 2018-01-25 NOTE — Progress Notes (Signed)
I have reviewed a Home Exercise Prescription with Matthew Briggs . [name] is currently exercising at home. The patient was advised to continue to walk 5-7 days a week for 30-45 minutes.  [name] and I discussed how to progress their exercise prescription. The patient stated that they understand the exercise prescription. We reviewed exercise guidelines, target heart rate during exercise, RPE Scale, weather conditions, NTG use, endpoints for exercise, warmup and cool down. Patient is encouraged to come to me with any questions. I will continue to follow up with the patient to assist them with progression and safety.    Carma Lair MS, ACSM CEP 01/25/2018 10:38 AM

## 2018-01-30 ENCOUNTER — Encounter (HOSPITAL_COMMUNITY): Payer: PPO

## 2018-01-30 ENCOUNTER — Encounter (HOSPITAL_COMMUNITY)
Admission: RE | Admit: 2018-01-30 | Discharge: 2018-01-30 | Disposition: A | Discharge status: Home / Self Care | Payer: PPO | Source: Ambulatory Visit | Attending: Cardiology | Admitting: Cardiology

## 2018-01-30 DIAGNOSIS — Z955 Presence of coronary angioplasty implant and graft: Secondary | ICD-10-CM | POA: Diagnosis not present

## 2018-01-30 NOTE — Progress Notes (Signed)
Matthew Briggs 72 y.o. male DOB: 1945-11-19 MRN: [id number]      Nutrition Note  1. Status post coronary artery stent placement    Past Medical History:  Diagnosis Date  . Abnormal computed tomography angiography (CTA) 10/05/2017  . Abnormal MRI   . Arthritis    "hands" (10/15/2017)  . Atrial fibrillation ([place])   . Coronary artery disease involving native heart 10/15/2017  . Essential hypertension 02/02/2015  . Hyperlipidemia with target LDL less than 70 02/02/2015  . Hypogonadism, male   . Laceration of chest wall 08/08/2017  . PVC's (premature ventricular contractions) 01/27/2017   Meds reviewed.    Current Outpatient Medications (Cardiovascular):  .  rosuvastatin (CRESTOR) 20 MG tablet, Take 1 tablet (20 mg total) by mouth at bedtime. .  valsartan-hydrochlorothiazide (DIOVAN HCT) 160-12.5 MG tablet, Take 1 tablet by mouth daily.   Current Outpatient Medications (Analgesics):  .  acetaminophen (TYLENOL) 325 MG tablet, Take 2 tablets (650 mg total) by mouth every 6 (six) hours. .  traMADol (ULTRAM) 50 MG tablet, Take 1 tablet (50 mg total) by mouth every 6 (six) hours as needed (mild pain).  Current Outpatient Medications (Hematological):  .  clopidogrel (PLAVIX) 75 MG tablet, Take 1 tablet (75 mg total) by mouth daily with breakfast. (Patient taking differently: Take 75 mg by mouth at bedtime. ) .  rivaroxaban (XARELTO) 20 MG TABS tablet, Take 1 tablet (20 mg total) by mouth daily with supper.  Current Outpatient Medications (Other):  Marland Kitchen  Calcium-Phosphorus-Vitamin D (CITRACAL +D3 PO), Take 1 tablet by mouth daily with supper. .  cholecalciferol (VITAMIN D) 1000 UNITS tablet, Take 1,000 Units by mouth daily with supper.  .  Coenzyme Q10 (CO Q 10) 100 MG CAPS, Take 100 mg by mouth daily with supper. Qunol CoQ10 .  diclofenac sodium (VOLTAREN) 1 % GEL, Apply 2-4 g topically 4 (four) times daily as needed (for arthritis pain). Javier Docker Oil 500 MG CAPS, Take 500 mg by mouth daily with  supper. .  methocarbamol (ROBAXIN) 500 MG tablet, Take 1 tablet (500 mg total) by mouth every 8 (eight) hours as needed for muscle spasms. .  Multiple Vitamin (MULTIVITAMIN WITH MINERALS) TABS tablet, Take 1 tablet by mouth daily with supper.  .  Potassium 99 MG TABS, Take 99 mg by mouth daily with supper. .  quiNINE (QUALAQUIN) 324 MG capsule, Take 648 mg by mouth daily as needed (for cramps.).    HT: Ht Readings from Last 1 Encounters:  01/07/18 5\' 8"  (1.727 m)    WT: Wt Readings from Last 5 Encounters:  01/07/18 185 lb 6.4 oz (84.1 kg)  01/03/18 185 lb 10 oz (84.2 kg)  12/31/17 184 lb (83.5 kg)  11/28/17 183 lb (83 kg)  10/24/17 186 lb 6.4 oz (84.6 kg)     There is no height or weight on file to calculate BMI.   Current tobacco use? No  Labs:  Lipid Panel     Component Value Date/Time   CHOL 140 11/28/2017 1201   TRIG 124 11/28/2017 1201   HDL 64 11/28/2017 1201   CHOLHDL 2.2 11/28/2017 1201   LDLCALC 51 11/28/2017 1201    No results found for: HGBA1C CBG (last 3)  No results for input(s): GLUCAP in the last 72 hours.  Nutrition Note Spoke with pt. Nutrition plan and goals reviewed with pt. Pt is following Step 2 of the Therapeutic Lifestyle Changes diet. Pt wants to lose wt. Pt has not been trying to  lose wt. Reeducated pt on weight loss tips reviewed at orientation. Recommended using measuring spoons and cups to ensure accuracy of portion size, food journaling, and eating regularly across the day. Per discussion, pt eats out frequently, frequently skips meals. Recommended pt decrease number of meals eaten out to reduce trans, saturated fats, and sodium. Additionally recommended pt eat regularly across the day to manage hunger and keep metabolism high. Pt was more open to dietitian recommendations at this time, compared to orientation. Will revisit at a later date.  Pt reports PCP telling him his blood sugar was elevated and he has pre-diabetes. Did not bring in most recent  A1c, requested pt bring in to RN or RD at next class. Recommended pt swap refined carbohydrates for more complex carbohydrates. Pt expressed understanding of the information reviewed. Pt aware of nutrition education classes offered and is unable to attend nutrition classes due to conflict with work.  Nutrition Diagnosis ? Overweight related to excessive energy intake as evidenced by a 28.19  Nutrition Intervention ? Pt's individual nutrition plan and goals reviewed with pt. ? Pt given handouts for: ? Nutrition I class ? Nutrition II class  ? Consistent vit K diet ? low sodium ? DM ? pre-diabetes  Nutrition Goal(s):   Pt to identify and limit food sources of saturated fat, trans fat, sodium, refined carbohydrates  Pt to identify food quantities necessary to achieve weight loss of 6-24 lbs. at graduation from cardiac rehab. Goal wt loss of 10-15 lb desired.   Pt to decrease frequency of eating out   Plan:  ? Pt to attend nutrition classes ? Nutrition I ? Nutrition II ? Portion Distortion  ? Diabetes Blitz ? Diabetes Q & Ae determined ? Will provide client-centered nutrition education as part of interdisciplinary care ? Monitor and evaluate progress toward nutrition goal with team.   Laurina Bustle, MS, RD, LDN 01/30/2018 8:47 AM

## 2018-01-31 DIAGNOSIS — S56494D Other injury of extensor muscle, fascia and tendon of left middle finger at forearm level, subsequent encounter: Secondary | ICD-10-CM | POA: Diagnosis not present

## 2018-01-31 DIAGNOSIS — S63283D Dislocation of proximal interphalangeal joint of left middle finger, subsequent encounter: Secondary | ICD-10-CM | POA: Diagnosis not present

## 2018-01-31 NOTE — Progress Notes (Signed)
Cardiac Individual Treatment Plan  Patient Details  Name: Matthew Briggs MRN: [id number] Date of Birth: 1945/06/03 Referring Provider:     CARDIAC REHAB PHASE II ORIENTATION from 01/03/2018 in [facility]  Referring Provider  Minus Breeding MD       Initial Encounter Date:    CARDIAC REHAB PHASE II ORIENTATION from 01/03/2018 in [facility]  Date  01/03/18      Visit Diagnosis: Status post coronary artery stent placement  Patient's Home Medications on Admission:  Current Outpatient Medications:  .  acetaminophen (TYLENOL) 325 MG tablet, Take 2 tablets (650 mg total) by mouth every 6 (six) hours., Disp: , Rfl:  .  Calcium-Phosphorus-Vitamin D (CITRACAL +D3 PO), Take 1 tablet by mouth daily with supper., Disp: , Rfl:  .  cholecalciferol (VITAMIN D) 1000 UNITS tablet, Take 1,000 Units by mouth daily with supper. , Disp: , Rfl:  .  clopidogrel (PLAVIX) 75 MG tablet, Take 1 tablet (75 mg total) by mouth daily with breakfast. (Patient taking differently: Take 75 mg by mouth at bedtime. ), Disp: 90 tablet, Rfl: 3 .  Coenzyme Q10 (CO Q 10) 100 MG CAPS, Take 100 mg by mouth daily with supper. Qunol CoQ10, Disp: , Rfl:  .  diclofenac sodium (VOLTAREN) 1 % GEL, Apply 2-4 g topically 4 (four) times daily as needed (for arthritis pain)., Disp: , Rfl:  .  Krill Oil 500 MG CAPS, Take 500 mg by mouth daily with supper., Disp: , Rfl:  .  methocarbamol (ROBAXIN) 500 MG tablet, Take 1 tablet (500 mg total) by mouth every 8 (eight) hours as needed for muscle spasms., Disp: 15 tablet, Rfl: 0 .  Multiple Vitamin (MULTIVITAMIN WITH MINERALS) TABS tablet, Take 1 tablet by mouth daily with supper. , Disp: , Rfl:  .  Potassium 99 MG TABS, Take 99 mg by mouth daily with supper., Disp: , Rfl:  .  quiNINE (QUALAQUIN) 324 MG capsule, Take 648 mg by mouth daily as needed (for cramps.). , Disp: , Rfl:  .  rivaroxaban (XARELTO) 20 MG TABS tablet, Take 1  tablet (20 mg total) by mouth daily with supper., Disp: 30 tablet, Rfl: 3 .  rosuvastatin (CRESTOR) 20 MG tablet, Take 1 tablet (20 mg total) by mouth at bedtime., Disp: 90 tablet, Rfl: 3 .  traMADol (ULTRAM) 50 MG tablet, Take 1 tablet (50 mg total) by mouth every 6 (six) hours as needed (mild pain)., Disp: 20 tablet, Rfl: 0 .  valsartan-hydrochlorothiazide (DIOVAN HCT) 160-12.5 MG tablet, Take 1 tablet by mouth daily., Disp: 30 tablet, Rfl: 3  Past Medical History: Past Medical History:  Diagnosis Date  . Abnormal computed tomography angiography (CTA) 10/05/2017  . Abnormal MRI   . Arthritis    "hands" (10/15/2017)  . Atrial fibrillation ([place])   . Coronary artery disease involving native heart 10/15/2017  . Essential hypertension 02/02/2015  . Hyperlipidemia with target LDL less than 70 02/02/2015  . Hypogonadism, male   . Laceration of chest wall 08/08/2017  . PVC's (premature ventricular contractions) 01/27/2017    Tobacco Use: Social History   Tobacco Use  Smoking Status Never Smoker  Smokeless Tobacco Never Used    Labs: Recent Review Flowsheet Data    Labs for ITP Cardiac and Pulmonary Rehab Latest Ref Rng & Units 08/08/2017 11/28/2017   Cholestrol 100 - 199 mg/dL - 140   LDLCALC 0 - 99 mg/dL - 51   HDL >39 mg/dL - 64  Trlycerides 0 - 149 mg/dL - 124   TCO2 22 - 32 mmol/L 24 -      Capillary Blood Glucose: No results found for: GLUCAP   Exercise Target Goals: Exercise Program Goal: Individual exercise prescription set using results from initial 6 min walk test and THRR while considering  patient's activity barriers and safety.   Exercise Prescription Goal: Initial exercise prescription builds to 30-45 minutes a day of aerobic activity, 2-3 days per week.  Home exercise guidelines will be given to patient during program as part of exercise prescription that the participant will acknowledge.  Activity Barriers & Risk Stratification: Activity Barriers & Cardiac Risk  Stratification - 01/03/18 1007      Activity Barriers & Cardiac Risk Stratification   Activity Barriers  Other (comment)    Comments  Right Hand Limitations, Left Leg Achilles Tendonitis, Right Leg Tendonitis, Occasional Hip Pain    Cardiac Risk Stratification  Moderate       6 Minute Walk: 6 Minute Walk    Row Name 01/03/18 1005         6 Minute Walk   Phase  Initial     Distance  1627 feet     Walk Time  6 minutes     # of Rest Breaks  0     MPH  3.08     METS  3.24     RPE  12     Perceived Dyspnea   0     VO2 Peak  11.4     Symptoms  No     Resting HR  58 bpm     Resting BP  120/70     Resting Oxygen Saturation   96 %     Exercise Oxygen Saturation  during 6 min walk  96 %     Max Ex. HR  80 bpm     Max Ex. BP  130/70     2 Minute Post BP  120/66        Oxygen Initial Assessment:   Oxygen Re-Evaluation:   Oxygen Discharge (Final Oxygen Re-Evaluation):   Initial Exercise Prescription: Initial Exercise Prescription - 01/03/18 1000      Date of Initial Exercise RX and Referring Provider   Date  01/03/18    Referring Provider  Minus Breeding MD     Expected Discharge Date  03/29/18      Recumbant Bike   Level  2    Watts  30    Minutes  10    METs  3.11      NuStep   Level  3    SPM  75    Minutes  10    METs  2.5      Track   Laps  12    Minutes  10    METs  3.07      Prescription Details   Frequency (times per week)  3x    Duration  Progress to 30 minutes of continuous aerobic without signs/symptoms of physical distress      Intensity   THRR 40-80% of Max Heartrate  60-120    Ratings of Perceived Exertion  11-13    Perceived Dyspnea  0-4      Progression   Progression  Continue progressive overload as per policy without signs/symptoms or physical distress.      Resistance Training   Training Prescription  Yes    Weight  4lbs    Reps  10-15  Perform Capillary Blood Glucose checks as needed.  Exercise Prescription  Changes: Exercise Prescription Changes    Row Name 01/07/18 1400 01/16/18 1529 01/25/18 1644         Response to Exercise   Blood Pressure (Admit)  130/80  130/62  120/76     Blood Pressure (Exercise)  156/80  132/74  120/80     Blood Pressure (Exit)  124/70  124/78  114/60     Heart Rate (Admit)  65 bpm  67 bpm  55 bpm     Heart Rate (Exercise)  100 bpm  109 bpm  114 bpm     Heart Rate (Exit)  64 bpm  69 bpm  70 bpm     Rating of Perceived Exertion (Exercise)  12  12  12      Perceived Dyspnea (Exercise)  0  0  0     Symptoms  None   None   None      Comments  Pt oriented to exercise equipment  -  -     Duration  Progress to 30 minutes of  aerobic without signs/symptoms of physical distress  Continue with 30 min of aerobic exercise without signs/symptoms of physical distress.  Continue with 30 min of aerobic exercise without signs/symptoms of physical distress.     Intensity  THRR New  THRR New  THRR unchanged       Progression   Progression  Continue to progress workloads to maintain intensity without signs/symptoms of physical distress.  Continue to progress workloads to maintain intensity without signs/symptoms of physical distress.  Continue to progress workloads to maintain intensity without signs/symptoms of physical distress.     Average METs  3.72  4.38  4.9       Resistance Training   Training Prescription  Yes  No  Yes     Weight  4lbs  -  4lbs     Reps  10-15  -  10-15     Time  10 Minutes  -  10 Minutes       Interval Training   Interval Training  -  No  No       Recumbant Bike   Level  2  3  3      Watts  42  58  58     Minutes  10  10  10      METs  3.78  4.22  4.22       NuStep   Level  4  5  5      SPM  85  100  110     Minutes  10  10  10      METs  4  4.6  6.1       Track   Laps  13  13  16      Minutes  10  10  10      METs  3.3  3.3  3.76       Home Exercise Plan   Plans to continue exercise at  -  -  Home (comment) Walking     Frequency  -  -  Add 3  additional days to program exercise sessions.     Initial Home Exercises Provided  -  -  01/25/18        Exercise Comments: Exercise Comments    Row Name 01/07/18 1403 01/25/18 1028 01/25/18 1036       Exercise Comments  Pt's first day of exercise. Pt  oriented to exercise equipment. Pt responded well to exercise workloads. Will continue to monitor.   Reviewed HEP with pt. Pt is currently walking at home for exercise. Pt also walks to Cardiac Rehab   Reviewed HEP with pt. Pt is currently walking at home for exercise. Pt also walks to Cardiac Rehab since he lives close to hospital. Will continue to monitor and follow up with pt regarding home exercise.         Exercise Goals and Review: Exercise Goals    Row Name 01/03/18 1013             Exercise Goals   Increase Physical Activity  Yes       Intervention  Provide advice, education, support and counseling about physical activity/exercise needs.;Develop an individualized exercise prescription for aerobic and resistive training based on initial evaluation findings, risk stratification, comorbidities and participant's personal goals.       Expected Outcomes  Short Term: Attend rehab on a regular basis to increase amount of physical activity.;Long Term: Add in home exercise to make exercise part of routine and to increase amount of physical activity.;Long Term: Exercising regularly at least 3-5 days a week.       Increase Strength and Stamina  Yes       Intervention  Provide advice, education, support and counseling about physical activity/exercise needs.;Develop an individualized exercise prescription for aerobic and resistive training based on initial evaluation findings, risk stratification, comorbidities and participant's personal goals.       Expected Outcomes  Short Term: Increase workloads from initial exercise prescription for resistance, speed, and METs.;Short Term: Perform resistance training exercises routinely during rehab and add  in resistance training at home;Long Term: Improve cardiorespiratory fitness, muscular endurance and strength as measured by increased METs and functional capacity (6MWT)       Able to understand and use rate of perceived exertion (RPE) scale  Yes       Intervention  Provide education and explanation on how to use RPE scale       Expected Outcomes  Long Term:  Able to use RPE to guide intensity level when exercising independently;Short Term: Able to use RPE daily in rehab to express subjective intensity level       Knowledge and understanding of Target Heart Rate Range (THRR)  Yes       Intervention  Provide education and explanation of THRR including how the numbers were predicted and where they are located for reference       Expected Outcomes  Short Term: Able to state/look up THRR;Short Term: Able to use daily as guideline for intensity in rehab;Long Term: Able to use THRR to govern intensity when exercising independently       Able to check pulse independently  Yes       Intervention  Provide education and demonstration on how to check pulse in carotid and radial arteries.;Review the importance of being able to check your own pulse for safety during independent exercise       Expected Outcomes  Short Term: Able to explain why pulse checking is important during independent exercise;Long Term: Able to check pulse independently and accurately       Understanding of Exercise Prescription  Yes       Intervention  Provide education, explanation, and written materials on patient's individual exercise prescription       Expected Outcomes  Short Term: Able to explain program exercise prescription;Long Term: Able to explain home exercise prescription to  exercise independently          Exercise Goals Re-Evaluation : Exercise Goals Re-Evaluation    Row Name 01/25/18 1036             Exercise Goal Re-Evaluation   Exercise Goals Review  Increase Physical Activity;Understanding of Exercise  Prescription;Increase Strength and Stamina;Knowledge and understanding of Target Heart Rate Range (THRR);Able to understand and use rate of perceived exertion (RPE) scale;Able to check pulse independently       Comments  Reviewed HEP with pt. ALso reviewed THRR, RPE Scale, weather precautions, endpoints of exercise, NTG use, warmup and cool down.        Expected Outcomes  Pt will continue to walk daily for exercise 30-45 minutes. Pt will continue to increase cardiorespiratory fitness and stamina. Will continue to monitor and progress pt as tolerated.           Discharge Exercise Prescription (Final Exercise Prescription Changes): Exercise Prescription Changes - 01/25/18 1644      Response to Exercise   Blood Pressure (Admit)  120/76    Blood Pressure (Exercise)  120/80    Blood Pressure (Exit)  114/60    Heart Rate (Admit)  55 bpm    Heart Rate (Exercise)  114 bpm    Heart Rate (Exit)  70 bpm    Rating of Perceived Exertion (Exercise)  12    Perceived Dyspnea (Exercise)  0    Symptoms  None     Duration  Continue with 30 min of aerobic exercise without signs/symptoms of physical distress.    Intensity  THRR unchanged      Progression   Progression  Continue to progress workloads to maintain intensity without signs/symptoms of physical distress.    Average METs  4.9      Resistance Training   Training Prescription  Yes    Weight  4lbs    Reps  10-15    Time  10 Minutes      Interval Training   Interval Training  No      Recumbant Bike   Level  3    Watts  58    Minutes  10    METs  4.22      NuStep   Level  5    SPM  110    Minutes  10    METs  6.1      Track   Laps  16    Minutes  10    METs  3.76      Home Exercise Plan   Plans to continue exercise at  Home (comment)   Walking   Frequency  Add 3 additional days to program exercise sessions.    Initial Home Exercises Provided  01/25/18       Nutrition:  Target Goals: Understanding of nutrition guidelines,  daily intake of sodium 1500mg , cholesterol 200mg , calories 30% from fat and 7% or less from saturated fats, daily to have 5 or more servings of fruits and vegetables.  Biometrics: Pre Biometrics - 01/03/18 1014      Pre Biometrics   Height  5\' 8"  (1.727 m)    Weight  84.2 kg    Waist Circumference  38.5 inches    Hip Circumference  40 inches    Waist to Hip Ratio  0.96 %    BMI (Calculated)  28.23    Triceps Skinfold  27 mm    % Body Fat  29.5 %    Grip Strength  42 kg    Flexibility  15 in    Single Leg Stand  30 seconds        Nutrition Therapy Plan and Nutrition Goals: Nutrition Therapy & Goals - 01/03/18 0849      Nutrition Therapy   Diet  consistent carbohydrate heart healthy      Personal Nutrition Goals   Nutrition Goal  Pt to identify food quantities necessary to achieve weight loss of 6-24 lbs. at graduation from cardiac rehab    Personal Goal #2  Pt to identify and limit food sources of saturated fat, trans fat, sodium, refined carbohydrates    Personal Goal #3  Pt to decrease frequency of eating out      Intervention Plan   Intervention  Prescribe, educate and counsel regarding individualized specific dietary modifications aiming towards targeted core components such as weight, hypertension, lipid management, diabetes, heart failure and other comorbidities.    Expected Outcomes  Short Term Goal: Understand basic principles of dietary content, such as calories, fat, sodium, cholesterol and nutrients.       Nutrition Assessments: Nutrition Assessments - 01/03/18 0900      MEDFICTS Scores   Pre Score  --   did not complete      Nutrition Goals Re-Evaluation: Nutrition Goals Re-Evaluation    [place] Name 01/03/18 0849             Goals   Current Weight  185 lb 10 oz (84.2 kg)          Nutrition Goals Re-Evaluation: Nutrition Goals Re-Evaluation    [place] Name 01/03/18 0849             Goals   Current Weight  185 lb 10 oz (84.2 kg)           Nutrition Goals Discharge (Final Nutrition Goals Re-Evaluation): Nutrition Goals Re-Evaluation - 01/03/18 0849      Goals   Current Weight  185 lb 10 oz (84.2 kg)       Psychosocial: Target Goals: Acknowledge presence or absence of significant depression and/or stress, maximize coping skills, provide positive support system. Participant is able to verbalize types and ability to use techniques and skills needed for reducing stress and depression.  Initial Review & Psychosocial Screening: Initial Psych Review & Screening - 01/03/18 0958      Initial Review   Current issues with  None Identified      Family Dynamics   Good Support System?  Yes   [place] lists his spouse as a source of support.     Barriers   Psychosocial barriers to participate in program  There are no identifiable barriers or psychosocial needs.      Screening Interventions   Interventions  Encouraged to exercise       Quality of Life Scores: Quality of Life - 01/03/18 0957      Quality of Life   Select  Quality of Life      Quality of Life Scores   Health/Function Pre  27.4 %    Socioeconomic Pre  25.57 %    Psych/Spiritual Pre  26.36 %    Family Pre  30 %    GLOBAL Pre  27.19 %      Scores of 19 and below usually indicate a poorer quality of life in these areas.  A difference of  2-3 points is a clinically meaningful difference.  A difference of 2-3 points in the total score of the Quality of Life Index  has been associated with significant improvement in overall quality of life, self-image, physical symptoms, and general health in studies assessing change in quality of life.  PHQ-9: Recent Review Flowsheet Data    Depression screen [facility] 2/9 01/07/2018   Decreased Interest 0   Down, Depressed, Hopeless 0   PHQ - 2 Score 0     Interpretation of Total Score  Total Score Depression Severity:  1-4 = Minimal depression, 5-9 = Mild depression, 10-14 = Moderate depression, 15-19 = Moderately severe  depression, 20-27 = Severe depression   Psychosocial Evaluation and Intervention: Psychosocial Evaluation - 01/07/18 0804      Psychosocial Evaluation & Interventions   Interventions  Encouraged to exercise with the program and follow exercise prescription    Comments  no psychosocial needs identified, no interventions necessary.  pt enjoys sailing and racing cars. pt presently is unable to participate in these activities. pt owns [employer].     Expected Outcomes  pt will exhibit positive outlook with good coping skills.     Continue Psychosocial Services   No Follow up required       Psychosocial Re-Evaluation: Psychosocial Re-Evaluation    [place] Name 01/25/18 1444             Psychosocial Re-Evaluation   Current issues with  None Identified       Comments  No psychosocial interventions identified.        Expected Outcomes  Dian will maintain a positive outlook with good coping skills.       Interventions  Encouraged to attend Cardiac Rehabilitation for the exercise       Continue Psychosocial Services   No Follow up required          Psychosocial Discharge (Final Psychosocial Re-Evaluation): Psychosocial Re-Evaluation - 01/25/18 1444      Psychosocial Re-Evaluation   Current issues with  None Identified    Comments  No psychosocial interventions identified.     Expected Outcomes  [place] will maintain a positive outlook with good coping skills.    Interventions  Encouraged to attend Cardiac Rehabilitation for the exercise    Continue Psychosocial Services   No Follow up required       Vocational Rehabilitation: Provide vocational rehab assistance to qualifying candidates.   Vocational Rehab Evaluation & Intervention: Vocational Rehab - 01/03/18 0957      Initial Vocational Rehab Evaluation & Intervention   Assessment shows need for Vocational Rehabilitation  No       Education: Education Goals: Education classes will be  provided on a weekly basis, covering required topics. Participant will state understanding/return demonstration of topics presented.  Learning Barriers/Preferences: Learning Barriers/Preferences - 01/03/18 1015      Learning Barriers/Preferences   Learning Barriers  None    Learning Preferences  Skilled Demonstration;Pictoral       Education Topics: Count Your Pulse:  -Group instruction provided by verbal instruction, demonstration, patient participation and written materials to support subject.  Instructors address importance of being able to find your pulse and how to count your pulse when at home without a heart monitor.  Patients get hands on experience counting their pulse with staff help and individually.   Heart Attack, Angina, and Risk Factor Modification:  -Group instruction provided by verbal instruction, video, and written materials to support subject.  Instructors address signs and symptoms of angina and heart attacks.    Also discuss risk factors for heart disease and how to make changes to  improve heart health risk factors.   Functional Fitness:  -Group instruction provided by verbal instruction, demonstration, patient participation, and written materials to support subject.  Instructors address safety measures for doing things around the house.  Discuss how to get up and down off the floor, how to pick things up properly, how to safely get out of a chair without assistance, and balance training.   Meditation and Mindfulness:  -Group instruction provided by verbal instruction, patient participation, and written materials to support subject.  Instructor addresses importance of mindfulness and meditation practice to help reduce stress and improve awareness.  Instructor also leads participants through a meditation exercise.    Stretching for Flexibility and Mobility:  -Group instruction provided by verbal instruction, patient participation, and written materials to support  subject.  Instructors lead participants through series of stretches that are designed to increase flexibility thus improving mobility.  These stretches are additional exercise for major muscle groups that are typically performed during regular warm up and cool down.   Hands Only CPR:  -Group verbal, video, and participation provides a basic overview of AHA guidelines for community CPR. Role-play of emergencies allow participants the opportunity to practice calling for help and chest compression technique with discussion of AED use.   Hypertension: -Group verbal and written instruction that provides a basic overview of hypertension including the most recent diagnostic guidelines, risk factor reduction with self-care instructions and medication management.    Nutrition I class: Heart Healthy Eating:  -Group instruction provided by PowerPoint slides, verbal discussion, and written materials to support subject matter. The instructor gives an explanation and review of the Therapeutic Lifestyle Changes diet recommendations, which includes a discussion on lipid goals, dietary fat, sodium, fiber, plant stanol/sterol esters, sugar, and the components of a well-balanced, healthy diet.   Nutrition II class: Lifestyle Skills:  -Group instruction provided by PowerPoint slides, verbal discussion, and written materials to support subject matter. The instructor gives an explanation and review of label reading, grocery shopping for heart health, heart healthy recipe modifications, and ways to make healthier choices when eating out.   Diabetes Question & Answer:  -Group instruction provided by PowerPoint slides, verbal discussion, and written materials to support subject matter. The instructor gives an explanation and review of diabetes co-morbidities, pre- and post-prandial blood glucose goals, pre-exercise blood glucose goals, signs, symptoms, and treatment of hypoglycemia and hyperglycemia, and foot care  basics.   Diabetes Blitz:  -Group instruction provided by PowerPoint slides, verbal discussion, and written materials to support subject matter. The instructor gives an explanation and review of the physiology behind type 1 and type 2 diabetes, diabetes medications and rational behind using different medications, pre- and post-prandial blood glucose recommendations and Hemoglobin A1c goals, diabetes diet, and exercise including blood glucose guidelines for exercising safely.    Portion Distortion:  -Group instruction provided by PowerPoint slides, verbal discussion, written materials, and food models to support subject matter. The instructor gives an explanation of serving size versus portion size, changes in portions sizes over the last 20 years, and what consists of a serving from each food group.   Stress Management:  -Group instruction provided by verbal instruction, video, and written materials to support subject matter.  Instructors review role of stress in heart disease and how to cope with stress positively.     Exercising on Your Own:  -Group instruction provided by verbal instruction, power point, and written materials to support subject.  Instructors discuss benefits of exercise, components of exercise, frequency  and intensity of exercise, and end points for exercise.  Also discuss use of nitroglycerin and activating EMS.  Review options of places to exercise outside of rehab.  Review guidelines for sex with heart disease.   Cardiac Drugs I:  -Group instruction provided by verbal instruction and written materials to support subject.  Instructor reviews cardiac drug classes: antiplatelets, anticoagulants, beta blockers, and statins.  Instructor discusses reasons, side effects, and lifestyle considerations for each drug class.   Cardiac Drugs II:  -Group instruction provided by verbal instruction and written materials to support subject.  Instructor reviews cardiac drug classes:  angiotensin converting enzyme inhibitors (ACE-I), angiotensin II receptor blockers (ARBs), nitrates, and calcium channel blockers.  Instructor discusses reasons, side effects, and lifestyle considerations for each drug class.   Anatomy and Physiology of the Circulatory System:  Group verbal and written instruction and models provide basic cardiac anatomy and physiology, with the coronary electrical and arterial systems. Review of: AMI, Angina, Valve disease, Heart Failure, Peripheral Artery Disease, Cardiac Arrhythmia, Pacemakers, and the ICD.   Other Education:  -Group or individual verbal, written, or video instructions that support the educational goals of the cardiac rehab program.   Holiday Eating Survival Tips:  -Group instruction provided by PowerPoint slides, verbal discussion, and written materials to support subject matter. The instructor gives patients tips, tricks, and techniques to help them not only survive but enjoy the holidays despite the onslaught of food that accompanies the holidays.   Knowledge Questionnaire Score: Knowledge Questionnaire Score - 01/03/18 0957      Knowledge Questionnaire Score   Pre Score  22/24       Core Components/Risk Factors/Patient Goals at Admission: Personal Goals and Risk Factors at Admission - 01/03/18 1017      Core Components/Risk Factors/Patient Goals on Admission    Weight Management  Yes;Weight Loss    Intervention  Weight Management: Develop a combined nutrition and exercise program designed to reach desired caloric intake, while maintaining appropriate intake of nutrient and fiber, sodium and fats, and appropriate energy expenditure required for the weight goal.;Weight Management: Provide education and appropriate resources to help participant work on and attain dietary goals.;Weight Management/Obesity: Establish reasonable short term and long term weight goals.;Obesity: Provide education and appropriate resources to help participant  work on and attain dietary goals.    Admit Weight  185 lb 10 oz (84.2 kg)    Goal Weight: Short Term  180 lb (81.6 kg)    Goal Weight: Long Term  175 lb (79.4 kg)    Expected Outcomes  Short Term: Continue to assess and modify interventions until short term weight is achieved;Long Term: Adherence to nutrition and physical activity/exercise program aimed toward attainment of established weight goal;Weight Loss: Understanding of general recommendations for a balanced deficit meal plan, which promotes 1-2 lb weight loss per week and includes a negative energy balance of [phone] kcal/d;Understanding recommendations for meals to include 15-35% energy as protein, 25-35% energy from fat, 35-60% energy from carbohydrates, less than 200mg  of dietary cholesterol, 20-35 gm of total fiber daily;Understanding of distribution of calorie intake throughout the day with the consumption of 4-5 meals/snacks    Hypertension  Yes    Intervention  Provide education on lifestyle modifcations including regular physical activity/exercise, weight management, moderate sodium restriction and increased consumption of fresh fruit, vegetables, and low fat dairy, alcohol moderation, and smoking cessation.;Monitor prescription use compliance.    Expected Outcomes  Short Term: Continued assessment and intervention until BP is < 140/33mm  HG in hypertensive participants. < 130/71mm HG in hypertensive participants with diabetes, heart failure or chronic kidney disease.;Long Term: Maintenance of blood pressure at goal levels.    Lipids  Yes    Intervention  Provide education and support for participant on nutrition & aerobic/resistive exercise along with prescribed medications to achieve LDL 70mg , HDL >40mg .    Expected Outcomes  Short Term: Participant states understanding of desired cholesterol values and is compliant with medications prescribed. Participant is following exercise prescription and nutrition guidelines.;Long Term:  Cholesterol controlled with medications as prescribed, with individualized exercise RX and with personalized nutrition plan. Value goals: LDL < 70mg , HDL > 40 mg.    Stress  Yes    Intervention  Offer individual and/or small group education and counseling on adjustment to heart disease, stress management and health-related lifestyle change. Teach and support self-help strategies.;Refer participants experiencing significant psychosocial distress to appropriate mental health specialists for further evaluation and treatment. When possible, include family members and significant others in education/counseling sessions.    Expected Outcomes  Short Term: Participant demonstrates changes in health-related behavior, relaxation and other stress management skills, ability to obtain effective social support, and compliance with psychotropic medications if prescribed.;Long Term: Emotional wellbeing is indicated by absence of clinically significant psychosocial distress or social isolation.       Core Components/Risk Factors/Patient Goals Review:  Goals and Risk Factor Review    Row Name 01/07/18 0811 01/25/18 1445           Core Components/Risk Factors/Patient Goals Review   Personal Goals Review  Weight Management/Obesity;Hypertension;Lipids;Stress  Weight Management/Obesity;Hypertension;Lipids;Stress      Review  pt with multiple CAD RF demonstrates eagerness to participate in CR program. pt personal goals are to lose weight, 170lb target weight, and develop exercise routine.   Pt with multiple CAD RF willling to participate in CR program. He is tolerating exercise well.      Expected Outcomes  pt will participate in CR exercise, nutrition and lifestyle modification activities.   pt will participate in CR exercise, nutrition and lifestyle modification activities.          Core Components/Risk Factors/Patient Goals at Discharge (Final Review):  Goals and Risk Factor Review - 01/25/18 1445      Core  Components/Risk Factors/Patient Goals Review   Personal Goals Review  Weight Management/Obesity;Hypertension;Lipids;Stress    Review  Pt with multiple CAD RF willling to participate in CR program. He is tolerating exercise well.    Expected Outcomes  pt will participate in CR exercise, nutrition and lifestyle modification activities.        ITP Comments: ITP Comments    Row Name 01/03/18 0752 01/07/18 0802 01/25/18 1443       ITP Comments  Dr. Fransico Him, Medical Director   pt started group exercise.  pt tolerated light activity without diffculty. pt oriented to exercise equipment and safety routine.   30 Day ITP Review. Nathyn is tolerating exercise well with his recent start in the CR Program. His vital signs are stable.         Comments: See ITP Comments.

## 2018-02-01 ENCOUNTER — Encounter (HOSPITAL_COMMUNITY)
Admission: RE | Admit: 2018-02-01 | Discharge: 2018-02-01 | Disposition: A | Discharge status: Home / Self Care | Payer: PPO | Source: Ambulatory Visit | Attending: Cardiology | Admitting: Cardiology

## 2018-02-01 ENCOUNTER — Encounter (HOSPITAL_COMMUNITY): Payer: PPO

## 2018-02-01 DIAGNOSIS — Z955 Presence of coronary angioplasty implant and graft: Secondary | ICD-10-CM

## 2018-02-04 ENCOUNTER — Encounter (HOSPITAL_COMMUNITY): Payer: PPO

## 2018-02-06 ENCOUNTER — Encounter (HOSPITAL_COMMUNITY): Payer: PPO

## 2018-02-08 ENCOUNTER — Encounter (HOSPITAL_COMMUNITY): Payer: PPO

## 2018-02-08 ENCOUNTER — Encounter (HOSPITAL_COMMUNITY)
Admission: RE | Admit: 2018-02-08 | Discharge: 2018-02-08 | Disposition: A | Discharge status: Home / Self Care | Payer: PPO | Source: Ambulatory Visit | Attending: Cardiology | Admitting: Cardiology

## 2018-02-08 DIAGNOSIS — Z955 Presence of coronary angioplasty implant and graft: Secondary | ICD-10-CM | POA: Diagnosis not present

## 2018-02-11 ENCOUNTER — Encounter (HOSPITAL_COMMUNITY)
Admission: RE | Admit: 2018-02-11 | Discharge: 2018-02-11 | Disposition: A | Discharge status: Home / Self Care | Payer: PPO | Source: Ambulatory Visit | Attending: Cardiology | Admitting: Cardiology

## 2018-02-11 ENCOUNTER — Encounter (HOSPITAL_COMMUNITY): Payer: PPO

## 2018-02-11 DIAGNOSIS — Z955 Presence of coronary angioplasty implant and graft: Secondary | ICD-10-CM | POA: Diagnosis not present

## 2018-02-12 DIAGNOSIS — S56494D Other injury of extensor muscle, fascia and tendon of left middle finger at forearm level, subsequent encounter: Secondary | ICD-10-CM | POA: Diagnosis not present

## 2018-02-12 DIAGNOSIS — S63283D Dislocation of proximal interphalangeal joint of left middle finger, subsequent encounter: Secondary | ICD-10-CM | POA: Diagnosis not present

## 2018-02-13 ENCOUNTER — Encounter (HOSPITAL_COMMUNITY): Payer: PPO

## 2018-02-13 ENCOUNTER — Encounter (HOSPITAL_COMMUNITY)
Admission: RE | Admit: 2018-02-13 | Discharge: 2018-02-13 | Disposition: A | Discharge status: Home / Self Care | Payer: PPO | Source: Ambulatory Visit | Attending: Cardiology | Admitting: Cardiology

## 2018-02-13 DIAGNOSIS — Z955 Presence of coronary angioplasty implant and graft: Secondary | ICD-10-CM | POA: Diagnosis not present

## 2018-02-15 ENCOUNTER — Encounter (HOSPITAL_COMMUNITY)
Admission: RE | Admit: 2018-02-15 | Discharge: 2018-02-15 | Disposition: A | Discharge status: Home / Self Care | Payer: PPO | Source: Ambulatory Visit | Attending: Cardiology | Admitting: Cardiology

## 2018-02-15 ENCOUNTER — Encounter (HOSPITAL_COMMUNITY): Payer: PPO

## 2018-02-15 DIAGNOSIS — S56494D Other injury of extensor muscle, fascia and tendon of left middle finger at forearm level, subsequent encounter: Secondary | ICD-10-CM | POA: Diagnosis not present

## 2018-02-15 DIAGNOSIS — Z955 Presence of coronary angioplasty implant and graft: Secondary | ICD-10-CM | POA: Diagnosis not present

## 2018-02-15 DIAGNOSIS — S63283D Dislocation of proximal interphalangeal joint of left middle finger, subsequent encounter: Secondary | ICD-10-CM | POA: Diagnosis not present

## 2018-02-18 ENCOUNTER — Encounter (HOSPITAL_COMMUNITY): Payer: PPO

## 2018-02-19 DIAGNOSIS — S63283D Dislocation of proximal interphalangeal joint of left middle finger, subsequent encounter: Secondary | ICD-10-CM | POA: Diagnosis not present

## 2018-02-19 DIAGNOSIS — S56494D Other injury of extensor muscle, fascia and tendon of left middle finger at forearm level, subsequent encounter: Secondary | ICD-10-CM | POA: Diagnosis not present

## 2018-02-20 ENCOUNTER — Encounter (HOSPITAL_COMMUNITY)
Admission: RE | Admit: 2018-02-20 | Discharge: 2018-02-20 | Disposition: A | Discharge status: Home / Self Care | Payer: PPO | Source: Ambulatory Visit | Attending: Cardiology | Admitting: Cardiology

## 2018-02-20 ENCOUNTER — Encounter (HOSPITAL_COMMUNITY): Payer: PPO

## 2018-02-20 DIAGNOSIS — Z955 Presence of coronary angioplasty implant and graft: Secondary | ICD-10-CM | POA: Diagnosis not present

## 2018-02-21 DIAGNOSIS — S63283D Dislocation of proximal interphalangeal joint of left middle finger, subsequent encounter: Secondary | ICD-10-CM | POA: Diagnosis not present

## 2018-02-21 DIAGNOSIS — S56494D Other injury of extensor muscle, fascia and tendon of left middle finger at forearm level, subsequent encounter: Secondary | ICD-10-CM | POA: Diagnosis not present

## 2018-02-21 DIAGNOSIS — M25642 Stiffness of left hand, not elsewhere classified: Secondary | ICD-10-CM | POA: Diagnosis not present

## 2018-02-21 DIAGNOSIS — S63253A Unspecified dislocation of left middle finger, initial encounter: Secondary | ICD-10-CM | POA: Diagnosis not present

## 2018-02-22 ENCOUNTER — Encounter (HOSPITAL_COMMUNITY): Payer: PPO

## 2018-02-22 ENCOUNTER — Encounter (HOSPITAL_COMMUNITY)
Admission: RE | Admit: 2018-02-22 | Discharge: 2018-02-22 | Disposition: A | Discharge status: Home / Self Care | Payer: PPO | Source: Ambulatory Visit | Attending: Cardiology | Admitting: Cardiology

## 2018-02-22 DIAGNOSIS — Z955 Presence of coronary angioplasty implant and graft: Secondary | ICD-10-CM | POA: Diagnosis not present

## 2018-02-25 ENCOUNTER — Encounter (HOSPITAL_COMMUNITY): Payer: PPO

## 2018-02-26 DIAGNOSIS — S56494D Other injury of extensor muscle, fascia and tendon of left middle finger at forearm level, subsequent encounter: Secondary | ICD-10-CM | POA: Diagnosis not present

## 2018-02-26 DIAGNOSIS — S63283D Dislocation of proximal interphalangeal joint of left middle finger, subsequent encounter: Secondary | ICD-10-CM | POA: Diagnosis not present

## 2018-02-27 ENCOUNTER — Encounter (HOSPITAL_COMMUNITY)
Admission: RE | Admit: 2018-02-27 | Discharge: 2018-02-27 | Disposition: A | Discharge status: Home / Self Care | Payer: PPO | Source: Ambulatory Visit | Attending: Cardiology | Admitting: Cardiology

## 2018-02-27 ENCOUNTER — Encounter (HOSPITAL_COMMUNITY): Payer: PPO

## 2018-02-27 DIAGNOSIS — Z955 Presence of coronary angioplasty implant and graft: Secondary | ICD-10-CM | POA: Diagnosis not present

## 2018-02-28 ENCOUNTER — Encounter (HOSPITAL_COMMUNITY): Payer: Self-pay

## 2018-02-28 DIAGNOSIS — S63283D Dislocation of proximal interphalangeal joint of left middle finger, subsequent encounter: Secondary | ICD-10-CM | POA: Diagnosis not present

## 2018-02-28 DIAGNOSIS — S56494D Other injury of extensor muscle, fascia and tendon of left middle finger at forearm level, subsequent encounter: Secondary | ICD-10-CM | POA: Diagnosis not present

## 2018-02-28 NOTE — Progress Notes (Signed)
Cardiac Individual Treatment Plan  Patient Details  Name: Matthew Briggs MRN: [id number] Date of Birth: 04-19-1946 Referring Provider:     CARDIAC REHAB PHASE II ORIENTATION from 01/03/2018 in [place]  Referring Provider  Minus Breeding MD       Initial Encounter Date:    CARDIAC REHAB PHASE II ORIENTATION from 01/03/2018 in [place]  Date  01/03/18      Visit Diagnosis: Status post coronary artery stent placement  Patient's Home Medications on Admission:  Current Outpatient Medications:  .  acetaminophen (TYLENOL) 325 MG tablet, Take 2 tablets (650 mg total) by mouth every 6 (six) hours., Disp: , Rfl:  .  Calcium-Phosphorus-Vitamin D (CITRACAL +D3 PO), Take 1 tablet by mouth daily with supper., Disp: , Rfl:  .  cholecalciferol (VITAMIN D) 1000 UNITS tablet, Take 1,000 Units by mouth daily with supper. , Disp: , Rfl:  .  clopidogrel (PLAVIX) 75 MG tablet, Take 1 tablet (75 mg total) by mouth daily with breakfast. (Patient taking differently: Take 75 mg by mouth at bedtime. ), Disp: 90 tablet, Rfl: 3 .  Coenzyme Q10 (CO Q 10) 100 MG CAPS, Take 100 mg by mouth daily with supper. Qunol CoQ10, Disp: , Rfl:  .  diclofenac sodium (VOLTAREN) 1 % GEL, Apply 2-4 g topically 4 (four) times daily as needed (for arthritis pain)., Disp: , Rfl:  .  Krill Oil 500 MG CAPS, Take 500 mg by mouth daily with supper., Disp: , Rfl:  .  methocarbamol (ROBAXIN) 500 MG tablet, Take 1 tablet (500 mg total) by mouth every 8 (eight) hours as needed for muscle spasms., Disp: 15 tablet, Rfl: 0 .  Multiple Vitamin (MULTIVITAMIN WITH MINERALS) TABS tablet, Take 1 tablet by mouth daily with supper. , Disp: , Rfl:  .  Potassium 99 MG TABS, Take 99 mg by mouth daily with supper., Disp: , Rfl:  .  quiNINE (QUALAQUIN) 324 MG capsule, Take 648 mg by mouth daily as needed (for cramps.). , Disp: , Rfl:  .  rivaroxaban (XARELTO) 20 MG TABS tablet, Take 1  tablet (20 mg total) by mouth daily with supper., Disp: 30 tablet, Rfl: 3 .  rosuvastatin (CRESTOR) 20 MG tablet, Take 1 tablet (20 mg total) by mouth at bedtime., Disp: 90 tablet, Rfl: 3 .  traMADol (ULTRAM) 50 MG tablet, Take 1 tablet (50 mg total) by mouth every 6 (six) hours as needed (mild pain)., Disp: 20 tablet, Rfl: 0 .  valsartan-hydrochlorothiazide (DIOVAN HCT) 160-12.5 MG tablet, Take 1 tablet by mouth daily., Disp: 30 tablet, Rfl: 3  Past Medical History: Past Medical History:  Diagnosis Date  . Abnormal computed tomography angiography (CTA) 10/05/2017  . Abnormal MRI   . Arthritis    "hands" (10/15/2017)  . Atrial fibrillation ([place])   . Coronary artery disease involving native heart 10/15/2017  . Essential hypertension 02/02/2015  . Hyperlipidemia with target LDL less than 70 02/02/2015  . Hypogonadism, male   . Laceration of chest wall 08/08/2017  . PVC's (premature ventricular contractions) 01/27/2017    Tobacco Use: Social History   Tobacco Use  Smoking Status Never Smoker  Smokeless Tobacco Never Used    Labs: Recent Review Flowsheet Data    Labs for ITP Cardiac and Pulmonary Rehab Latest Ref Rng & Units 08/08/2017 11/28/2017   Cholestrol 100 - 199 mg/dL - 140   LDLCALC 0 - 99 mg/dL - 51   HDL >39 mg/dL - 64  Trlycerides 0 - 149 mg/dL - 124   TCO2 22 - 32 mmol/L 24 -      Capillary Blood Glucose: No results found for: GLUCAP   Exercise Target Goals: Exercise Program Goal: Individual exercise prescription set using results from initial 6 min walk test and THRR while considering  patient's activity barriers and safety.   Exercise Prescription Goal: Initial exercise prescription builds to 30-45 minutes a day of aerobic activity, 2-3 days per week.  Home exercise guidelines will be given to patient during program as part of exercise prescription that the participant will acknowledge.  Activity Barriers & Risk Stratification: Activity Barriers & Cardiac Risk  Stratification - 01/03/18 1007      Activity Barriers & Cardiac Risk Stratification   Activity Barriers  Other (comment)    Comments  Right Hand Limitations, Left Leg Achilles Tendonitis, Right Leg Tendonitis, Occasional Hip Pain    Cardiac Risk Stratification  Moderate       6 Minute Walk: 6 Minute Walk    Row Name 01/03/18 1005         6 Minute Walk   Phase  Initial     Distance  1627 feet     Walk Time  6 minutes     # of Rest Breaks  0     MPH  3.08     METS  3.24     RPE  12     Perceived Dyspnea   0     VO2 Peak  11.4     Symptoms  No     Resting HR  58 bpm     Resting BP  120/70     Resting Oxygen Saturation   96 %     Exercise Oxygen Saturation  during 6 min walk  96 %     Max Ex. HR  80 bpm     Max Ex. BP  130/70     2 Minute Post BP  120/66        Oxygen Initial Assessment:   Oxygen Re-Evaluation:   Oxygen Discharge (Final Oxygen Re-Evaluation):   Initial Exercise Prescription: Initial Exercise Prescription - 01/03/18 1000      Date of Initial Exercise RX and Referring Provider   Date  01/03/18    Referring Provider  Minus Breeding MD     Expected Discharge Date  03/29/18      Recumbant Bike   Level  2    Watts  30    Minutes  10    METs  3.11      NuStep   Level  3    SPM  75    Minutes  10    METs  2.5      Track   Laps  12    Minutes  10    METs  3.07      Prescription Details   Frequency (times per week)  3x    Duration  Progress to 30 minutes of continuous aerobic without signs/symptoms of physical distress      Intensity   THRR 40-80% of Max Heartrate  60-120    Ratings of Perceived Exertion  11-13    Perceived Dyspnea  0-4      Progression   Progression  Continue progressive overload as per policy without signs/symptoms or physical distress.      Resistance Training   Training Prescription  Yes    Weight  4lbs    Reps  10-15  Perform Capillary Blood Glucose checks as needed.  Exercise Prescription  Changes: Exercise Prescription Changes    Row Name 01/07/18 1400 01/16/18 1529 01/25/18 1644 02/13/18 1100 02/27/18 1600     Response to Exercise   Blood Pressure (Admit)  130/80  130/62  120/76  128/70  129/73   Blood Pressure (Exercise)  156/80  132/74  120/80  136/60  154/80   Blood Pressure (Exit)  124/70  124/78  114/60  100/70  128/70   Heart Rate (Admit)  65 bpm  67 bpm  55 bpm  62 bpm  63 bpm   Heart Rate (Exercise)  100 bpm  109 bpm  114 bpm  114 bpm  115 bpm   Heart Rate (Exit)  64 bpm  69 bpm  70 bpm  73 bpm  66 bpm   Rating of Perceived Exertion (Exercise)  _0 Perceived Dyspnea (Exercise)  0  0  0  0  0   Symptoms  None   None   None   None  None   Comments  Pt oriented to exercise equipment  -  -  None  None   Duration  Progress to 30 minutes of  aerobic without signs/symptoms of physical distress  Continue with 30 min of aerobic exercise without signs/symptoms of physical distress.  Continue with 30 min of aerobic exercise without signs/symptoms of physical distress.  Continue with 30 min of aerobic exercise without signs/symptoms of physical distress.  Progress to 30 minutes of  aerobic without signs/symptoms of physical distress   Intensity  THRR New  THRR New  THRR unchanged  THRR unchanged  THRR unchanged     Progression   Progression  Continue to progress workloads to maintain intensity without signs/symptoms of physical distress.  Continue to progress workloads to maintain intensity without signs/symptoms of physical distress.  Continue to progress workloads to maintain intensity without signs/symptoms of physical distress.  Continue to progress workloads to maintain intensity without signs/symptoms of physical distress.  Continue to progress workloads to maintain intensity without signs/symptoms of physical distress.   Average METs  3.72  4.38  4.9  5.78  5.89     Resistance Training   Training Prescription  Yes  No  Yes  No  No   Weight  4lbs  -  4lbs   -  -   Reps  10-15  -  10-15  -  -   Time  10 Minutes  -  10 Minutes  -  -     Interval Training   Interval Training  -  No  No  -  No     Recumbant Bike   Level  _1 3.5   Watts  42  58  58  -  -   Minutes  _2 METs  3.78  4.22  4.22  6.8  7.1     NuStep   Level  _3 SPM  85  100  110  110  115   Minutes  _4 METs  4  4.6  6.1  5.7  6.1     Track   Laps  _5 Minutes  10  _0 METs  3.3  3.3  3.76  5.2  4.45     Home Exercise Plan   Plans to continue exercise at  -  -  Home (comment) Walking  Home (comment) Walking  Home (comment) Walking   Frequency  -  -  Add 3 additional days to program exercise sessions.  Add 3 additional days to program exercise sessions.  Add 3 additional days to program exercise sessions.   Initial Home Exercises Provided  -  -  01/25/18  01/25/18  01/25/18      Exercise Comments: Exercise Comments    Row Name 01/07/18 1403 01/25/18 1028 01/25/18 1036 02/21/18 0713     Exercise Comments  Pt's first day of exercise. Pt oriented to exercise equipment. Pt responded well to exercise workloads. Will continue to monitor.   Reviewed HEP with pt. Pt is currently walking at home for exercise. Pt also walks to Cardiac Rehab   Reviewed HEP with pt. Pt is currently walking at home for exercise. Pt also walks to Cardiac Rehab since he lives close to hospital. Will continue to monitor and follow up with pt regarding home exercise.   Reviewed METs and Goals with pt. Pt is continuing to respond well to workloads. Will continue to monitor and progess workloads as tolerated.       Exercise Goals and Review: Exercise Goals    Row Name 01/03/18 1013             Exercise Goals   Increase Physical Activity  Yes       Intervention  Provide advice, education, support and counseling about physical activity/exercise needs.;Develop an individualized exercise prescription for aerobic and  resistive training based on initial evaluation findings, risk stratification, comorbidities and participant's personal goals.       Expected Outcomes  Short Term: Attend rehab on a regular basis to increase amount of physical activity.;Long Term: Add in home exercise to make exercise part of routine and to increase amount of physical activity.;Long Term: Exercising regularly at least 3-5 days a week.       Increase Strength and Stamina  Yes       Intervention  Provide advice, education, support and counseling about physical activity/exercise needs.;Develop an individualized exercise prescription for aerobic and resistive training based on initial evaluation findings, risk stratification, comorbidities and participant's personal goals.       Expected Outcomes  Short Term: Increase workloads from initial exercise prescription for resistance, speed, and METs.;Short Term: Perform resistance training exercises routinely during rehab and add in resistance training at home;Long Term: Improve cardiorespiratory fitness, muscular endurance and strength as measured by increased METs and functional capacity (6MWT)       Able to understand and use rate of perceived exertion (RPE) scale  Yes       Intervention  Provide education and explanation on how to use RPE scale       Expected Outcomes  Long Term:  Able to use RPE to guide intensity level when exercising independently;Short Term: Able to use RPE daily in rehab to express subjective intensity level       Knowledge and understanding of Target Heart Rate Range (THRR)  Yes       Intervention  Provide education and explanation of THRR including how the numbers were predicted and where they are located for reference       Expected Outcomes  Short Term: Able to state/look up THRR;Short  Term: Able to use daily as guideline for intensity in rehab;Long Term: Able to use THRR to govern intensity when exercising independently       Able to check pulse independently  Yes        Intervention  Provide education and demonstration on how to check pulse in carotid and radial arteries.;Review the importance of being able to check your own pulse for safety during independent exercise       Expected Outcomes  Short Term: Able to explain why pulse checking is important during independent exercise;Long Term: Able to check pulse independently and accurately       Understanding of Exercise Prescription  Yes       Intervention  Provide education, explanation, and written materials on patient's individual exercise prescription       Expected Outcomes  Short Term: Able to explain program exercise prescription;Long Term: Able to explain home exercise prescription to exercise independently          Exercise Goals Re-Evaluation : Exercise Goals Re-Evaluation    Row Name 01/25/18 1036 02/21/18 0716           Exercise Goal Re-Evaluation   Exercise Goals Review  Increase Physical Activity;Understanding of Exercise Prescription;Increase Strength and Stamina;Knowledge and understanding of Target Heart Rate Range (THRR);Able to understand and use rate of perceived exertion (RPE) scale;Able to check pulse independently  Increase Physical Activity;Understanding of Exercise Prescription      Comments  Reviewed HEP with pt. ALso reviewed THRR, RPE Scale, weather precautions, endpoints of exercise, NTG use, warmup and cool down.   Pt states he is enjoying rehab. Pt is responding well to workload increases. Pt's average MET level has incresed 1 MET level to 5.78. Pt works long work hours, but still sets time aside to exercise on his own.       Expected Outcomes  Pt will continue to walk daily for exercise 30-45 minutes. Pt will continue to increase cardiorespiratory fitness and stamina. Will continue to monitor and progress pt as tolerated.   Pt will continue to walk daily for exercise 30-45 minutes. Will continue to work with pt to increase workloads.          Discharge Exercise Prescription  (Final Exercise Prescription Changes): Exercise Prescription Changes - 02/27/18 1600      Response to Exercise   Blood Pressure (Admit)  129/73    Blood Pressure (Exercise)  154/80    Blood Pressure (Exit)  128/70    Heart Rate (Admit)  63 bpm    Heart Rate (Exercise)  115 bpm    Heart Rate (Exit)  66 bpm    Rating of Perceived Exertion (Exercise)  12    Perceived Dyspnea (Exercise)  0    Symptoms  None    Comments  None    Duration  Progress to 30 minutes of  aerobic without signs/symptoms of physical distress    Intensity  THRR unchanged      Progression   Progression  Continue to progress workloads to maintain intensity without signs/symptoms of physical distress.    Average METs  5.89      Resistance Training   Training Prescription  No      Interval Training   Interval Training  No      Recumbant Bike   Level  3.5    Minutes  10    METs  7.1      NuStep   Level  5    SPM  115  Minutes  10    METs  6.1      Track   Laps  20    Minutes  10    METs  4.45      Home Exercise Plan   Plans to continue exercise at  Home (comment)   Walking   Frequency  Add 3 additional days to program exercise sessions.    Initial Home Exercises Provided  01/25/18       Nutrition:  Target Goals: Understanding of nutrition guidelines, daily intake of sodium <1552m, cholesterol <2038m calories 30% from fat and 7% or less from saturated fats, daily to have 5 or more servings of fruits and vegetables.  Biometrics: Pre Biometrics - 01/03/18 1014      Pre Biometrics   Height  _0  (1.727 m)    Weight  84.2 kg    Waist Circumference  38.5 inches    Hip Circumference  40 inches    Waist to Hip Ratio  0.96 %    BMI (Calculated)  28.23    Triceps Skinfold  27 mm    % Body Fat  29.5 %    Grip Strength  42 kg    Flexibility  15 in    Single Leg Stand  30 seconds        Nutrition Therapy Plan and Nutrition Goals: Nutrition Therapy & Goals - 01/03/18 0849      Nutrition  Therapy   Diet  consistent carbohydrate heart healthy      Personal Nutrition Goals   Nutrition Goal  Pt to identify food quantities necessary to achieve weight loss of 6-24 lbs. at graduation from cardiac rehab    Personal Goal #2  Pt to identify and limit food sources of saturated fat, trans fat, sodium, refined carbohydrates    Personal Goal #3  Pt to decrease frequency of eating out      Intervention Plan   Intervention  Prescribe, educate and counsel regarding individualized specific dietary modifications aiming towards targeted core components such as weight, hypertension, lipid management, diabetes, heart failure and other comorbidities.    Expected Outcomes  Short Term Goal: Understand basic principles of dietary content, such as calories, fat, sodium, cholesterol and nutrients.       Nutrition Assessments: Nutrition Assessments - 01/03/18 0900      MEDFICTS Scores   Pre Score  --   did not complete      Nutrition Goals Re-Evaluation: Nutrition Goals Re-Evaluation    [place] 01/03/18 0849             Goals   Current Weight  185 lb 10 oz (84.2 kg)          Nutrition Goals Re-Evaluation: Nutrition Goals Re-Evaluation    [place] 01/03/18 0849             Goals   Current Weight  185 lb 10 oz (84.2 kg)          Nutrition Goals Discharge (Final Nutrition Goals Re-Evaluation): Nutrition Goals Re-Evaluation - 01/03/18 0849      Goals   Current Weight  185 lb 10 oz (84.2 kg)       Psychosocial: Target Goals: Acknowledge presence or absence of significant depression and/or stress, maximize coping skills, provide positive support system. Participant is able to verbalize types and ability to use techniques and skills needed for reducing stress and depression.  Initial Review & Psychosocial Screening: Initial Psych Review & Screening - 01/03/18 [id number]  Initial Review   Current issues with  None Identified      Family Dynamics   Good Support System?   Yes   Matthew Briggs lists his spouse as a source of support.     Barriers   Psychosocial barriers to participate in program  There are no identifiable barriers or psychosocial needs.      Screening Interventions   Interventions  Encouraged to exercise       Quality of Life Scores: Quality of Life - 01/03/18 0957      Quality of Life   Select  Quality of Life      Quality of Life Scores   Health/Function Pre  27.4 %    Socioeconomic Pre  25.57 %    Psych/Spiritual Pre  26.36 %    Family Pre  30 %    GLOBAL Pre  27.19 %      Scores of 19 and below usually indicate a poorer quality of life in these areas.  A difference of  2-3 points is a clinically meaningful difference.  A difference of 2-3 points in the total score of the Quality of Life Index has been associated with significant improvement in overall quality of life, self-image, physical symptoms, and general health in studies assessing change in quality of life.  PHQ-9: Recent Review Flowsheet Data    Depression screen [facility] 2/9 01/07/2018   Decreased Interest 0   Down, Depressed, Hopeless 0   PHQ - 2 Score 0     Interpretation of Total Score  Total Score Depression Severity:  1-4 = Minimal depression, 5-9 = Mild depression, 10-14 = Moderate depression, 15-19 = Moderately severe depression, 20-27 = Severe depression   Psychosocial Evaluation and Intervention: Psychosocial Evaluation - 01/07/18 0804      Psychosocial Evaluation & Interventions   Interventions  Encouraged to exercise with the program and follow exercise prescription    Comments  no psychosocial needs identified, no interventions necessary.  pt enjoys sailing and racing cars. pt presently is unable to participate in these activities. pt owns [employer].     Expected Outcomes  pt will exhibit positive outlook with good coping skills.     Continue Psychosocial Services   No Follow up required       Psychosocial  Re-Evaluation: Psychosocial Re-Evaluation    [place] Name 01/25/18 1444 02/28/18 2353           Psychosocial Re-Evaluation   Current issues with  None Identified  None Identified      Comments  No psychosocial interventions identified.   No psychosocial interventions identified.       Expected Outcomes  Matthew Briggs will maintain a positive outlook with good coping skills.  Matthew Briggs will maintain a positive outlook with good coping skills.      Interventions  Encouraged to attend Cardiac Rehabilitation for the exercise  Encouraged to attend Cardiac Rehabilitation for the exercise      Continue Psychosocial Services   No Follow up required  No Follow up required         Psychosocial Discharge (Final Psychosocial Re-Evaluation): Psychosocial Re-Evaluation - 02/28/18 6144      Psychosocial Re-Evaluation   Current issues with  None Identified    Comments  No psychosocial interventions identified.     Expected Outcomes  Matthew Briggs will maintain a positive outlook with good coping skills.    Interventions  Encouraged to attend Cardiac Rehabilitation for the exercise    Continue Psychosocial Services  No Follow up required       Vocational Rehabilitation: Provide vocational rehab assistance to qualifying candidates.   Vocational Rehab Evaluation & Intervention: Vocational Rehab - 01/03/18 0957      Initial Vocational Rehab Evaluation & Intervention   Assessment shows need for Vocational Rehabilitation  No       Education: Education Goals: Education classes will be provided on a weekly basis, covering required topics. Participant will state understanding/return demonstration of topics presented.  Learning Barriers/Preferences: Learning Barriers/Preferences - 01/03/18 1015      Learning Barriers/Preferences   Learning Barriers  None    Learning Preferences  Skilled Demonstration;Pictoral       Education Topics: Count Your Pulse:  -Group instruction provided by verbal instruction,  demonstration, patient participation and written materials to support subject.  Instructors address importance of being able to find your pulse and how to count your pulse when at home without a heart monitor.  Patients get hands on experience counting their pulse with staff help and individually.   Heart Attack, Angina, and Risk Factor Modification:  -Group instruction provided by verbal instruction, video, and written materials to support subject.  Instructors address signs and symptoms of angina and heart attacks.    Also discuss risk factors for heart disease and how to make changes to improve heart health risk factors.   Functional Fitness:  -Group instruction provided by verbal instruction, demonstration, patient participation, and written materials to support subject.  Instructors address safety measures for doing things around the house.  Discuss how to get up and down off the floor, how to pick things up properly, how to safely get out of a chair without assistance, and balance training.   Meditation and Mindfulness:  -Group instruction provided by verbal instruction, patient participation, and written materials to support subject.  Instructor addresses importance of mindfulness and meditation practice to help reduce stress and improve awareness.  Instructor also leads participants through a meditation exercise.    Stretching for Flexibility and Mobility:  -Group instruction provided by verbal instruction, patient participation, and written materials to support subject.  Instructors lead participants through series of stretches that are designed to increase flexibility thus improving mobility.  These stretches are additional exercise for major muscle groups that are typically performed during regular warm up and cool down.   Hands Only CPR:  -Group verbal, video, and participation provides a basic overview of AHA guidelines for community CPR. Role-play of emergencies allow participants  the opportunity to practice calling for help and chest compression technique with discussion of AED use.   Hypertension: -Group verbal and written instruction that provides a basic overview of hypertension including the most recent diagnostic guidelines, risk factor reduction with self-care instructions and medication management.    Nutrition I class: Heart Healthy Eating:  -Group instruction provided by PowerPoint slides, verbal discussion, and written materials to support subject matter. The instructor gives an explanation and review of the Therapeutic Lifestyle Changes diet recommendations, which includes a discussion on lipid goals, dietary fat, sodium, fiber, plant stanol/sterol esters, sugar, and the components of a well-balanced, healthy diet.   Nutrition II class: Lifestyle Skills:  -Group instruction provided by PowerPoint slides, verbal discussion, and written materials to support subject matter. The instructor gives an explanation and review of label reading, grocery shopping for heart health, heart healthy recipe modifications, and ways to make healthier choices when eating out.   Diabetes Question & Answer:  -Group instruction provided by PowerPoint slides, verbal discussion, and  written materials to support subject matter. The instructor gives an explanation and review of diabetes co-morbidities, pre- and post-prandial blood glucose goals, pre-exercise blood glucose goals, signs, symptoms, and treatment of hypoglycemia and hyperglycemia, and foot care basics.   Diabetes Blitz:  -Group instruction provided by PowerPoint slides, verbal discussion, and written materials to support subject matter. The instructor gives an explanation and review of the physiology behind type 1 and type 2 diabetes, diabetes medications and rational behind using different medications, pre- and post-prandial blood glucose recommendations and Hemoglobin A1c goals, diabetes diet, and exercise including blood  glucose guidelines for exercising safely.    Portion Distortion:  -Group instruction provided by PowerPoint slides, verbal discussion, written materials, and food models to support subject matter. The instructor gives an explanation of serving size versus portion size, changes in portions sizes over the last 20 years, and what consists of a serving from each food group.   Stress Management:  -Group instruction provided by verbal instruction, video, and written materials to support subject matter.  Instructors review role of stress in heart disease and how to cope with stress positively.     Exercising on Your Own:  -Group instruction provided by verbal instruction, power point, and written materials to support subject.  Instructors discuss benefits of exercise, components of exercise, frequency and intensity of exercise, and end points for exercise.  Also discuss use of nitroglycerin and activating EMS.  Review options of places to exercise outside of rehab.  Review guidelines for sex with heart disease.   Cardiac Drugs I:  -Group instruction provided by verbal instruction and written materials to support subject.  Instructor reviews cardiac drug classes: antiplatelets, anticoagulants, beta blockers, and statins.  Instructor discusses reasons, side effects, and lifestyle considerations for each drug class.   Cardiac Drugs II:  -Group instruction provided by verbal instruction and written materials to support subject.  Instructor reviews cardiac drug classes: angiotensin converting enzyme inhibitors (ACE-I), angiotensin II receptor blockers (ARBs), nitrates, and calcium channel blockers.  Instructor discusses reasons, side effects, and lifestyle considerations for each drug class.   Anatomy and Physiology of the Circulatory System:  Group verbal and written instruction and models provide basic cardiac anatomy and physiology, with the coronary electrical and arterial systems. Review of: AMI,  Angina, Valve disease, Heart Failure, Peripheral Artery Disease, Cardiac Arrhythmia, Pacemakers, and the ICD.   Other Education:  -Group or individual verbal, written, or video instructions that support the educational goals of the cardiac rehab program.   Holiday Eating Survival Tips:  -Group instruction provided by PowerPoint slides, verbal discussion, and written materials to support subject matter. The instructor gives patients tips, tricks, and techniques to help them not only survive but enjoy the holidays despite the onslaught of food that accompanies the holidays.   Knowledge Questionnaire Score: Knowledge Questionnaire Score - 01/03/18 0957      Knowledge Questionnaire Score   Pre Score  22/24       Core Components/Risk Factors/Patient Goals at Admission: Personal Goals and Risk Factors at Admission - 01/03/18 1017      Core Components/Risk Factors/Patient Goals on Admission    Weight Management  Yes;Weight Loss    Intervention  Weight Management: Develop a combined nutrition and exercise program designed to reach desired caloric intake, while maintaining appropriate intake of nutrient and fiber, sodium and fats, and appropriate energy expenditure required for the weight goal.;Weight Management: Provide education and appropriate resources to help participant work on and attain dietary goals.;Weight Management/Obesity: Establish  reasonable short term and long term weight goals.;Obesity: Provide education and appropriate resources to help participant work on and attain dietary goals.    Admit Weight  185 lb 10 oz (84.2 kg)    Goal Weight: Short Term  180 lb (81.6 kg)    Goal Weight: Long Term  175 lb (79.4 kg)    Expected Outcomes  Short Term: Continue to assess and modify interventions until short term weight is achieved;Long Term: Adherence to nutrition and physical activity/exercise program aimed toward attainment of established weight goal;Weight Loss: Understanding of general  recommendations for a balanced deficit meal plan, which promotes 1-2 lb weight loss per week and includes a negative energy balance of [phone] kcal/d;Understanding recommendations for meals to include 15-35% energy as protein, 25-35% energy from fat, 35-60% energy from carbohydrates, less than 246m of dietary cholesterol, 20-35 gm of total fiber daily;Understanding of distribution of calorie intake throughout the day with the consumption of 4-5 meals/snacks    Hypertension  Yes    Intervention  Provide education on lifestyle modifcations including regular physical activity/exercise, weight management, moderate sodium restriction and increased consumption of fresh fruit, vegetables, and low fat dairy, alcohol moderation, and smoking cessation.;Monitor prescription use compliance.    Expected Outcomes  Short Term: Continued assessment and intervention until BP is < 140/963mHG in hypertensive participants. < 130/8037mG in hypertensive participants with diabetes, heart failure or chronic kidney disease.;Long Term: Maintenance of blood pressure at goal levels.    Lipids  Yes    Intervention  Provide education and support for participant on nutrition & aerobic/resistive exercise along with prescribed medications to achieve LDL <40m67mDL >40mg39m Expected Outcomes  Short Term: Participant states understanding of desired cholesterol values and is compliant with medications prescribed. Participant is following exercise prescription and nutrition guidelines.;Long Term: Cholesterol controlled with medications as prescribed, with individualized exercise RX and with personalized nutrition plan. Value goals: LDL < 40mg,8m > 40 mg.    Stress  Yes    Intervention  Offer individual and/or small group education and counseling on adjustment to heart disease, stress management and health-related lifestyle change. Teach and support self-help strategies.;Refer participants experiencing significant psychosocial distress to  appropriate mental health specialists for further evaluation and treatment. When possible, include family members and significant others in education/counseling sessions.    Expected Outcomes  Short Term: Participant demonstrates changes in health-related behavior, relaxation and other stress management skills, ability to obtain effective social support, and compliance with psychotropic medications if prescribed.;Long Term: Emotional wellbeing is indicated by absence of clinically significant psychosocial distress or social isolation.       Core Components/Risk Factors/Patient Goals Review:  Goals and Risk Factor Review    Row Name 01/07/18 0811 [id number] 02/28/18 0821         Core Components/Risk Factors/Patient Goals Review   Personal Goals Review  Weight Management/Obesity;Hypertension;Lipids;Stress  Weight Management/Obesity;Hypertension;Lipids;Stress  Weight Management/Obesity;Hypertension;Lipids;Stress     Review  pt with multiple CAD RF demonstrates eagerness to participate in CR program. pt personal goals are to lose weight, 170lb target weight, and develop exercise routine.   Pt with multiple CAD RF willling to participate in CR program. He is tolerating exercise well.  Pt with multiple CAD RF willling to participate in CR program. He is tolerating exercise well.  VSS.      Expected Outcomes  pt will participate in CR exercise, nutrition and lifestyle modification activities.   pt will participate in CR exercise, nutrition  and lifestyle modification activities.   Matthew Briggs will participate in CR exercise, nutrition and lifestyle modification activities.         Core Components/Risk Factors/Patient Goals at Discharge (Final Review):  Goals and Risk Factor Review - 02/28/18 0821      Core Components/Risk Factors/Patient Goals Review   Personal Goals Review  Weight Management/Obesity;Hypertension;Lipids;Stress    Review  Pt with multiple CAD RF willling to participate in CR program. He  is tolerating exercise well.  VSS.     Expected Outcomes  Matthew Briggs will participate in CR exercise, nutrition and lifestyle modification activities.        ITP Comments: ITP Comments    Row Name 01/03/18 0752 01/07/18 0802 01/25/18 1443 02/28/18 0816     ITP Comments  Dr. Fransico Him, Medical Director   pt started group exercise.  pt tolerated light activity without diffculty. pt oriented to exercise equipment and safety routine.   30 Day ITP Review. Matthew Briggs is tolerating exercise well with his recent start in the CR Program. His vital signs are stable.   30 Day ITP Review. Matthew Briggs is tolerating his workloads well with exercise. VSS.       Comments: See ITP Comments

## 2018-03-01 ENCOUNTER — Encounter (HOSPITAL_COMMUNITY): Payer: PPO

## 2018-03-01 ENCOUNTER — Encounter (HOSPITAL_COMMUNITY)
Admission: RE | Admit: 2018-03-01 | Discharge: 2018-03-01 | Disposition: A | Discharge status: Home / Self Care | Payer: PPO | Source: Ambulatory Visit | Attending: Cardiology | Admitting: Cardiology

## 2018-03-01 DIAGNOSIS — Z955 Presence of coronary angioplasty implant and graft: Secondary | ICD-10-CM

## 2018-03-04 ENCOUNTER — Encounter (HOSPITAL_COMMUNITY): Payer: PPO

## 2018-03-04 ENCOUNTER — Encounter (HOSPITAL_COMMUNITY)
Admission: RE | Admit: 2018-03-04 | Discharge: 2018-03-04 | Disposition: A | Discharge status: Home / Self Care | Payer: PPO | Source: Ambulatory Visit | Attending: Cardiology | Admitting: Cardiology

## 2018-03-04 DIAGNOSIS — Z955 Presence of coronary angioplasty implant and graft: Secondary | ICD-10-CM | POA: Diagnosis not present

## 2018-03-05 DIAGNOSIS — S63283D Dislocation of proximal interphalangeal joint of left middle finger, subsequent encounter: Secondary | ICD-10-CM | POA: Diagnosis not present

## 2018-03-05 DIAGNOSIS — S56494D Other injury of extensor muscle, fascia and tendon of left middle finger at forearm level, subsequent encounter: Secondary | ICD-10-CM | POA: Diagnosis not present

## 2018-03-06 ENCOUNTER — Encounter (HOSPITAL_COMMUNITY): Payer: PPO

## 2018-03-07 DIAGNOSIS — S56494D Other injury of extensor muscle, fascia and tendon of left middle finger at forearm level, subsequent encounter: Secondary | ICD-10-CM | POA: Diagnosis not present

## 2018-03-07 DIAGNOSIS — S63283D Dislocation of proximal interphalangeal joint of left middle finger, subsequent encounter: Secondary | ICD-10-CM | POA: Diagnosis not present

## 2018-03-08 ENCOUNTER — Encounter (HOSPITAL_COMMUNITY)
Admission: RE | Admit: 2018-03-08 | Discharge: 2018-03-08 | Disposition: A | Discharge status: Home / Self Care | Payer: PPO | Source: Ambulatory Visit | Attending: Cardiology | Admitting: Cardiology

## 2018-03-08 ENCOUNTER — Encounter (HOSPITAL_COMMUNITY): Payer: PPO

## 2018-03-08 DIAGNOSIS — Z955 Presence of coronary angioplasty implant and graft: Secondary | ICD-10-CM | POA: Diagnosis not present

## 2018-03-11 ENCOUNTER — Encounter (HOSPITAL_COMMUNITY)
Admission: RE | Admit: 2018-03-11 | Discharge: 2018-03-11 | Disposition: A | Discharge status: Home / Self Care | Payer: PPO | Source: Ambulatory Visit | Attending: Cardiology | Admitting: Cardiology

## 2018-03-11 ENCOUNTER — Encounter (HOSPITAL_COMMUNITY): Payer: PPO

## 2018-03-11 DIAGNOSIS — Z955 Presence of coronary angioplasty implant and graft: Secondary | ICD-10-CM | POA: Diagnosis not present

## 2018-03-12 DIAGNOSIS — S63283D Dislocation of proximal interphalangeal joint of left middle finger, subsequent encounter: Secondary | ICD-10-CM | POA: Diagnosis not present

## 2018-03-12 DIAGNOSIS — S56494D Other injury of extensor muscle, fascia and tendon of left middle finger at forearm level, subsequent encounter: Secondary | ICD-10-CM | POA: Diagnosis not present

## 2018-03-13 ENCOUNTER — Encounter (HOSPITAL_COMMUNITY): Payer: PPO

## 2018-03-13 ENCOUNTER — Encounter (HOSPITAL_COMMUNITY)
Admission: RE | Admit: 2018-03-13 | Discharge: 2018-03-13 | Disposition: A | Discharge status: Home / Self Care | Payer: PPO | Source: Ambulatory Visit | Attending: Cardiology | Admitting: Cardiology

## 2018-03-13 DIAGNOSIS — Z955 Presence of coronary angioplasty implant and graft: Secondary | ICD-10-CM | POA: Diagnosis not present

## 2018-03-14 DIAGNOSIS — S63283D Dislocation of proximal interphalangeal joint of left middle finger, subsequent encounter: Secondary | ICD-10-CM | POA: Diagnosis not present

## 2018-03-14 DIAGNOSIS — S56494D Other injury of extensor muscle, fascia and tendon of left middle finger at forearm level, subsequent encounter: Secondary | ICD-10-CM | POA: Diagnosis not present

## 2018-03-15 ENCOUNTER — Encounter (HOSPITAL_COMMUNITY)
Admission: RE | Admit: 2018-03-15 | Discharge: 2018-03-15 | Disposition: A | Discharge status: Home / Self Care | Payer: PPO | Source: Ambulatory Visit | Attending: Cardiology | Admitting: Cardiology

## 2018-03-15 ENCOUNTER — Encounter (HOSPITAL_COMMUNITY): Payer: PPO

## 2018-03-15 DIAGNOSIS — Z955 Presence of coronary angioplasty implant and graft: Secondary | ICD-10-CM | POA: Diagnosis not present

## 2018-03-18 ENCOUNTER — Ambulatory Visit (HOSPITAL_COMMUNITY)
Admission: RE | Admit: 2018-03-18 | Discharge: 2018-03-18 | Disposition: A | Discharge status: Home / Self Care | Payer: PPO | Source: Ambulatory Visit | Attending: Internal Medicine | Admitting: Internal Medicine

## 2018-03-18 ENCOUNTER — Encounter (HOSPITAL_COMMUNITY): Payer: PPO

## 2018-03-18 ENCOUNTER — Encounter (HOSPITAL_COMMUNITY)
Admission: RE | Admit: 2018-03-18 | Discharge: 2018-03-18 | Disposition: A | Discharge status: Home / Self Care | Payer: PPO | Source: Ambulatory Visit | Attending: Cardiology | Admitting: Cardiology

## 2018-03-18 DIAGNOSIS — Z955 Presence of coronary angioplasty implant and graft: Secondary | ICD-10-CM | POA: Diagnosis not present

## 2018-03-18 NOTE — Progress Notes (Signed)
Daily Session Note  Patient Details  Name: Matthew Briggs MRN: [id number] Date of Birth: 12-Dec-1945 Referring Provider:   Flowsheet Row CARDIAC REHAB PHASE II ORIENTATION from 01/03/2018 in [place]  Referring Provider  Minus Breeding MD       Encounter Date: 03/18/2018  Check In: Session Check In - 03/18/18 0743    Check-In          Supervising physician immediately available to respond to emergencies  [facility] immediately available    Physician(s)  Dr. Ree Kida    Location  [facility]    Staff Present  Dorma Russell, MS,ACSM CEP, Exercise Physiologist;Jomo Forand, RN, BSN;Molly DiVincenzo, MS, ACSM RCEP, Exercise Physiologist;Olinty Ida, MS, ACSM CEP, Exercise Physiologist           Capillary Blood Glucose: No results found for this or any previous visit (from the past 24 hour(s)).    Social History   Tobacco Use  Smoking Status Never Smoker  Smokeless Tobacco Never Used    Goals Met:  Exercise tolerated well  Goals Unmet:  HR  Comments: pt with known h/o atrial fibrillation had atrial flutter with exercise-highest rate 150bpm.  Pt asymptomatic. Pt 12 lead EKG obtained for rhythm identification. BP:  128/80.   Pt rate resolved with rest to 93 atrial fibrillation.  PC to Dr. Rosezella [place] triage nurse Caryl Pina to report.   [facility] faxed for review.  Caryl Pina will discuss with Dr. Percival Spanish and advise pt.No new orders received.  Pt verbalized understanding.  Andi Hence, RN, BSN Cardiac Pulmonary Rehab    Dr. Fransico Him is Medical Director for Cardiac Rehab at [facility].

## 2018-03-19 DIAGNOSIS — R82998 Other abnormal findings in urine: Secondary | ICD-10-CM | POA: Diagnosis not present

## 2018-03-19 DIAGNOSIS — S63283D Dislocation of proximal interphalangeal joint of left middle finger, subsequent encounter: Secondary | ICD-10-CM | POA: Diagnosis not present

## 2018-03-19 DIAGNOSIS — S56494D Other injury of extensor muscle, fascia and tendon of left middle finger at forearm level, subsequent encounter: Secondary | ICD-10-CM | POA: Diagnosis not present

## 2018-03-20 ENCOUNTER — Encounter (HOSPITAL_COMMUNITY)
Admission: RE | Admit: 2018-03-20 | Discharge: 2018-03-20 | Disposition: A | Discharge status: Home / Self Care | Payer: PPO | Source: Ambulatory Visit | Attending: Cardiology | Admitting: Cardiology

## 2018-03-20 ENCOUNTER — Encounter (HOSPITAL_COMMUNITY): Payer: PPO

## 2018-03-20 DIAGNOSIS — Z23 Encounter for immunization: Secondary | ICD-10-CM | POA: Diagnosis not present

## 2018-03-20 DIAGNOSIS — R7301 Impaired fasting glucose: Secondary | ICD-10-CM | POA: Diagnosis not present

## 2018-03-20 DIAGNOSIS — E7849 Other hyperlipidemia: Secondary | ICD-10-CM | POA: Diagnosis not present

## 2018-03-20 DIAGNOSIS — Z955 Presence of coronary angioplasty implant and graft: Secondary | ICD-10-CM | POA: Diagnosis not present

## 2018-03-20 DIAGNOSIS — Z125 Encounter for screening for malignant neoplasm of prostate: Secondary | ICD-10-CM | POA: Diagnosis not present

## 2018-03-20 DIAGNOSIS — I10 Essential (primary) hypertension: Secondary | ICD-10-CM | POA: Diagnosis not present

## 2018-03-21 DIAGNOSIS — S63283D Dislocation of proximal interphalangeal joint of left middle finger, subsequent encounter: Secondary | ICD-10-CM | POA: Diagnosis not present

## 2018-03-21 DIAGNOSIS — S56494D Other injury of extensor muscle, fascia and tendon of left middle finger at forearm level, subsequent encounter: Secondary | ICD-10-CM | POA: Diagnosis not present

## 2018-03-22 ENCOUNTER — Encounter (HOSPITAL_COMMUNITY): Payer: PPO

## 2018-03-22 ENCOUNTER — Encounter (HOSPITAL_COMMUNITY): Payer: Self-pay

## 2018-03-25 ENCOUNTER — Encounter (HOSPITAL_COMMUNITY): Payer: PPO

## 2018-03-26 DIAGNOSIS — R7301 Impaired fasting glucose: Secondary | ICD-10-CM | POA: Diagnosis not present

## 2018-03-26 DIAGNOSIS — Z1389 Encounter for screening for other disorder: Secondary | ICD-10-CM | POA: Diagnosis not present

## 2018-03-26 DIAGNOSIS — I480 Paroxysmal atrial fibrillation: Secondary | ICD-10-CM | POA: Diagnosis not present

## 2018-03-26 DIAGNOSIS — G5603 Carpal tunnel syndrome, bilateral upper limbs: Secondary | ICD-10-CM | POA: Diagnosis not present

## 2018-03-26 DIAGNOSIS — Z7901 Long term (current) use of anticoagulants: Secondary | ICD-10-CM | POA: Diagnosis not present

## 2018-03-26 DIAGNOSIS — R740 Nonspecific elevation of levels of transaminase and lactic acid dehydrogenase [LDH]: Secondary | ICD-10-CM | POA: Diagnosis not present

## 2018-03-26 DIAGNOSIS — E7849 Other hyperlipidemia: Secondary | ICD-10-CM | POA: Diagnosis not present

## 2018-03-26 DIAGNOSIS — Z6829 Body mass index (BMI) 29.0-29.9, adult: Secondary | ICD-10-CM | POA: Diagnosis not present

## 2018-03-26 DIAGNOSIS — Z125 Encounter for screening for malignant neoplasm of prostate: Secondary | ICD-10-CM | POA: Diagnosis not present

## 2018-03-26 DIAGNOSIS — Z0000 Encounter for general adult medical examination without abnormal findings: Secondary | ICD-10-CM | POA: Diagnosis not present

## 2018-03-26 DIAGNOSIS — I2510 Atherosclerotic heart disease of native coronary artery without angina pectoris: Secondary | ICD-10-CM | POA: Diagnosis not present

## 2018-03-26 DIAGNOSIS — I10 Essential (primary) hypertension: Secondary | ICD-10-CM | POA: Diagnosis not present

## 2018-03-27 ENCOUNTER — Encounter (HOSPITAL_COMMUNITY)
Admission: RE | Admit: 2018-03-27 | Discharge: 2018-03-27 | Disposition: A | Discharge status: Home / Self Care | Payer: PPO | Source: Ambulatory Visit | Attending: Cardiology | Admitting: Cardiology

## 2018-03-27 ENCOUNTER — Encounter (HOSPITAL_COMMUNITY): Payer: PPO

## 2018-03-27 DIAGNOSIS — Z955 Presence of coronary angioplasty implant and graft: Secondary | ICD-10-CM

## 2018-03-28 DIAGNOSIS — S56494D Other injury of extensor muscle, fascia and tendon of left middle finger at forearm level, subsequent encounter: Secondary | ICD-10-CM | POA: Diagnosis not present

## 2018-03-28 DIAGNOSIS — S63283D Dislocation of proximal interphalangeal joint of left middle finger, subsequent encounter: Secondary | ICD-10-CM | POA: Diagnosis not present

## 2018-03-28 DIAGNOSIS — M25642 Stiffness of left hand, not elsewhere classified: Secondary | ICD-10-CM | POA: Diagnosis not present

## 2018-03-28 NOTE — Progress Notes (Signed)
Cardiac Individual Treatment Plan  Patient Details  Name: Matthew Briggs MRN: [id number] Date of Birth: 1945/06/03 Referring Provider:     CARDIAC REHAB PHASE II ORIENTATION from 01/03/2018 in [facility]  Referring Provider  Minus Breeding MD       Initial Encounter Date:    CARDIAC REHAB PHASE II ORIENTATION from 01/03/2018 in [facility]  Date  01/03/18      Visit Diagnosis: Status post coronary artery stent placement  Patient's Home Medications on Admission:  Current Outpatient Medications:  .  acetaminophen (TYLENOL) 325 MG tablet, Take 2 tablets (650 mg total) by mouth every 6 (six) hours., Disp: , Rfl:  .  Calcium-Phosphorus-Vitamin D (CITRACAL +D3 PO), Take 1 tablet by mouth daily with supper., Disp: , Rfl:  .  cholecalciferol (VITAMIN D) 1000 UNITS tablet, Take 1,000 Units by mouth daily with supper. , Disp: , Rfl:  .  clopidogrel (PLAVIX) 75 MG tablet, Take 1 tablet (75 mg total) by mouth daily with breakfast. (Patient taking differently: Take 75 mg by mouth at bedtime. ), Disp: 90 tablet, Rfl: 3 .  Coenzyme Q10 (CO Q 10) 100 MG CAPS, Take 100 mg by mouth daily with supper. Qunol CoQ10, Disp: , Rfl:  .  diclofenac sodium (VOLTAREN) 1 % GEL, Apply 2-4 g topically 4 (four) times daily as needed (for arthritis pain)., Disp: , Rfl:  .  Krill Oil 500 MG CAPS, Take 500 mg by mouth daily with supper., Disp: , Rfl:  .  methocarbamol (ROBAXIN) 500 MG tablet, Take 1 tablet (500 mg total) by mouth every 8 (eight) hours as needed for muscle spasms., Disp: 15 tablet, Rfl: 0 .  Multiple Vitamin (MULTIVITAMIN WITH MINERALS) TABS tablet, Take 1 tablet by mouth daily with supper. , Disp: , Rfl:  .  Potassium 99 MG TABS, Take 99 mg by mouth daily with supper., Disp: , Rfl:  .  quiNINE (QUALAQUIN) 324 MG capsule, Take 648 mg by mouth daily as needed (for cramps.). , Disp: , Rfl:  .  rivaroxaban (XARELTO) 20 MG TABS tablet, Take 1  tablet (20 mg total) by mouth daily with supper., Disp: 30 tablet, Rfl: 3 .  rosuvastatin (CRESTOR) 20 MG tablet, Take 1 tablet (20 mg total) by mouth at bedtime., Disp: 90 tablet, Rfl: 3 .  traMADol (ULTRAM) 50 MG tablet, Take 1 tablet (50 mg total) by mouth every 6 (six) hours as needed (mild pain)., Disp: 20 tablet, Rfl: 0 .  valsartan-hydrochlorothiazide (DIOVAN HCT) 160-12.5 MG tablet, Take 1 tablet by mouth daily., Disp: 30 tablet, Rfl: 3  Past Medical History: Past Medical History:  Diagnosis Date  . Abnormal computed tomography angiography (CTA) 10/05/2017  . Abnormal MRI   . Arthritis    "hands" (10/15/2017)  . Atrial fibrillation ([place])   . Coronary artery disease involving native heart 10/15/2017  . Essential hypertension 02/02/2015  . Hyperlipidemia with target LDL less than 70 02/02/2015  . Hypogonadism, male   . Laceration of chest wall 08/08/2017  . PVC's (premature ventricular contractions) 01/27/2017    Tobacco Use: Social History   Tobacco Use  Smoking Status Never Smoker  Smokeless Tobacco Never Used    Labs: Recent Review Flowsheet Data    Labs for ITP Cardiac and Pulmonary Rehab Latest Ref Rng & Units 08/08/2017 11/28/2017   Cholestrol 100 - 199 mg/dL - 140   LDLCALC 0 - 99 mg/dL - 51   HDL >39 mg/dL - 64  Trlycerides 0 - 149 mg/dL - 124   TCO2 22 - 32 mmol/L 24 -      Capillary Blood Glucose: No results found for: GLUCAP   Exercise Target Goals: Exercise Program Goal: Individual exercise prescription set using results from initial 6 min walk test and THRR while considering  patient's activity barriers and safety.   Exercise Prescription Goal: Initial exercise prescription builds to 30-45 minutes a day of aerobic activity, 2-3 days per week.  Home exercise guidelines will be given to patient during program as part of exercise prescription that the participant will acknowledge.  Activity Barriers & Risk Stratification: Activity Barriers & Cardiac Risk  Stratification - 01/03/18 1007      Activity Barriers & Cardiac Risk Stratification   Activity Barriers  Other (comment)    Comments  Right Hand Limitations, Left Leg Achilles Tendonitis, Right Leg Tendonitis, Occasional Hip Pain    Cardiac Risk Stratification  Moderate       6 Minute Walk: 6 Minute Walk    Row Name 01/03/18 1005         6 Minute Walk   Phase  Initial     Distance  1627 feet     Walk Time  6 minutes     # of Rest Breaks  0     MPH  3.08     METS  3.24     RPE  12     Perceived Dyspnea   0     VO2 Peak  11.4     Symptoms  No     Resting HR  58 bpm     Resting BP  120/70     Resting Oxygen Saturation   96 %     Exercise Oxygen Saturation  during 6 min walk  96 %     Max Ex. HR  80 bpm     Max Ex. BP  130/70     2 Minute Post BP  120/66        Oxygen Initial Assessment:   Oxygen Re-Evaluation:   Oxygen Discharge (Final Oxygen Re-Evaluation):   Initial Exercise Prescription: Initial Exercise Prescription - 01/03/18 1000      Date of Initial Exercise RX and Referring Provider   Date  01/03/18    Referring Provider  Minus Breeding MD     Expected Discharge Date  03/29/18      Recumbant Bike   Level  2    Watts  30    Minutes  10    METs  3.11      NuStep   Level  3    SPM  75    Minutes  10    METs  2.5      Track   Laps  12    Minutes  10    METs  3.07      Prescription Details   Frequency (times per week)  3x    Duration  Progress to 30 minutes of continuous aerobic without signs/symptoms of physical distress      Intensity   THRR 40-80% of Max Heartrate  60-120    Ratings of Perceived Exertion  11-13    Perceived Dyspnea  0-4      Progression   Progression  Continue progressive overload as per policy without signs/symptoms or physical distress.      Resistance Training   Training Prescription  Yes    Weight  4lbs    Reps  10-15  Perform Capillary Blood Glucose checks as needed.  Exercise Prescription  Changes: Exercise Prescription Changes    Row Name 01/07/18 1400 01/16/18 1529 01/25/18 1644 02/13/18 1100 02/27/18 1600     Response to Exercise   Blood Pressure (Admit)  130/80  130/62  120/76  128/70  129/73   Blood Pressure (Exercise)  156/80  132/74  120/80  136/60  154/80   Blood Pressure (Exit)  124/70  124/78  114/60  100/70  128/70   Heart Rate (Admit)  65 bpm  67 bpm  55 bpm  62 bpm  63 bpm   Heart Rate (Exercise)  100 bpm  109 bpm  114 bpm  114 bpm  115 bpm   Heart Rate (Exit)  64 bpm  69 bpm  70 bpm  73 bpm  66 bpm   Rating of Perceived Exertion (Exercise)  _0 Perceived Dyspnea (Exercise)  0  0  0  0  0   Symptoms  None   None   None   None  None   Comments  Pt oriented to exercise equipment  -  -  None  None   Duration  Progress to 30 minutes of  aerobic without signs/symptoms of physical distress  Continue with 30 min of aerobic exercise without signs/symptoms of physical distress.  Continue with 30 min of aerobic exercise without signs/symptoms of physical distress.  Continue with 30 min of aerobic exercise without signs/symptoms of physical distress.  Progress to 30 minutes of  aerobic without signs/symptoms of physical distress   Intensity  THRR New  THRR New  THRR unchanged  THRR unchanged  THRR unchanged     Progression   Progression  Continue to progress workloads to maintain intensity without signs/symptoms of physical distress.  Continue to progress workloads to maintain intensity without signs/symptoms of physical distress.  Continue to progress workloads to maintain intensity without signs/symptoms of physical distress.  Continue to progress workloads to maintain intensity without signs/symptoms of physical distress.  Continue to progress workloads to maintain intensity without signs/symptoms of physical distress.   Average METs  3.72  4.38  4.9  5.78  5.89     Resistance Training   Training Prescription  Yes  No  Yes  No  No   Weight  4lbs  -  4lbs   -  -   Reps  10-15  -  10-15  -  -   Time  10 Minutes  -  10 Minutes  -  -     Interval Training   Interval Training  -  No  No  -  No     Recumbant Bike   Level  _1 3.5   Watts  42  58  58  -  -   Minutes  _2 METs  3.78  4.22  4.22  6.8  7.1     NuStep   Level  _3 SPM  85  100  110  110  115   Minutes  _4 METs  4  4.6  6.1  5.7  6.1     Track   Laps  _5 Minutes  10  _0 METs  3.3  3.3  3.76  5.2  4.45     Home Exercise Plan   Plans to continue exercise at  -  -  Home (comment) Walking  Home (comment) Walking  Home (comment) Walking   Frequency  -  -  Add 3 additional days to program exercise sessions.  Add 3 additional days to program exercise sessions.  Add 3 additional days to program exercise sessions.   Initial Home Exercises Provided  -  -  01/25/18  01/25/18  01/25/18   Row Name 03/08/18 1650 03/25/18 0900           Response to Exercise   Blood Pressure (Admit)  110/68  144/66      Blood Pressure (Exercise)  130/62  144/80      Blood Pressure (Exit)  118/70  138/64      Heart Rate (Admit)  57 bpm  62 bpm      Heart Rate (Exercise)  106 bpm  111 bpm      Heart Rate (Exit)  68 bpm  62 bpm      Rating of Perceived Exertion (Exercise)  10  12      Perceived Dyspnea (Exercise)  0  0      Symptoms  None  None      Comments  None  None      Duration  Progress to 30 minutes of  aerobic without signs/symptoms of physical distress  Continue with 30 min of aerobic exercise without signs/symptoms of physical distress.      Intensity  THRR unchanged  THRR unchanged        Progression   Progression  Continue to progress workloads to maintain intensity without signs/symptoms of physical distress.  Continue to progress workloads to maintain intensity without signs/symptoms of physical distress.      Average METs  6  5.3        Resistance Training   Training Prescription  Yes  No       Weight  5lbs  -      Reps  10-15  -      Time  10 Minutes  -        Interval Training   Interval Training  No  No        Recumbant Bike   Level  3.5  4      Minutes  10  10      METs  7.1  6.2        NuStep   Level  5  6      SPM  115  115      Minutes  10  10      METs  6.1  5.4        Track   Laps  18  18      Minutes  10  10      METs  4.14  4.41        Home Exercise Plan   Plans to continue exercise at  Home (comment) Walking  Home (comment) Walking      Frequency  Add 3 additional days to program exercise sessions.  Add 3 additional days to program exercise sessions.      Initial Home Exercises Provided  01/25/18  01/25/18         Exercise Comments: Exercise Comments    Row Name 01/07/18 1403 01/25/18 1028 01/25/18  1036 02/21/18 0713 03/25/18 0901   Exercise Comments  Pt's first day of exercise. Pt oriented to exercise equipment. Pt responded well to exercise workloads. Will continue to monitor.   Reviewed HEP with pt. Pt is currently walking at home for exercise. Pt also walks to Cardiac Rehab   Reviewed HEP with pt. Pt is currently walking at home for exercise. Pt also walks to Cardiac Rehab since he lives close to hospital. Will continue to monitor and follow up with pt regarding home exercise.   Reviewed METs and Goals with pt. Pt is continuing to respond well to workloads. Will continue to monitor and progess workloads as tolerated.  Reviewed METs and Goals with pt. Pt has responded well to recent workload increases. Pt is continuing to enjoy rehab. Will continue to monitor and progress pt.       Exercise Goals and Review: Exercise Goals    Row Name 01/03/18 1013             Exercise Goals   Increase Physical Activity  Yes       Intervention  Provide advice, education, support and counseling about physical activity/exercise needs.;Develop an individualized exercise prescription for aerobic and resistive training based on initial evaluation findings, risk  stratification, comorbidities and participant's personal goals.       Expected Outcomes  Short Term: Attend rehab on a regular basis to increase amount of physical activity.;Long Term: Add in home exercise to make exercise part of routine and to increase amount of physical activity.;Long Term: Exercising regularly at least 3-5 days a week.       Increase Strength and Stamina  Yes       Intervention  Provide advice, education, support and counseling about physical activity/exercise needs.;Develop an individualized exercise prescription for aerobic and resistive training based on initial evaluation findings, risk stratification, comorbidities and participant's personal goals.       Expected Outcomes  Short Term: Increase workloads from initial exercise prescription for resistance, speed, and METs.;Short Term: Perform resistance training exercises routinely during rehab and add in resistance training at home;Long Term: Improve cardiorespiratory fitness, muscular endurance and strength as measured by increased METs and functional capacity (6MWT)       Able to understand and use rate of perceived exertion (RPE) scale  Yes       Intervention  Provide education and explanation on how to use RPE scale       Expected Outcomes  Long Term:  Able to use RPE to guide intensity level when exercising independently;Short Term: Able to use RPE daily in rehab to express subjective intensity level       Knowledge and understanding of Target Heart Rate Range (THRR)  Yes       Intervention  Provide education and explanation of THRR including how the numbers were predicted and where they are located for reference       Expected Outcomes  Short Term: Able to state/look up THRR;Short Term: Able to use daily as guideline for intensity in rehab;Long Term: Able to use THRR to govern intensity when exercising independently       Able to check pulse independently  Yes       Intervention  Provide education and demonstration on how to  check pulse in carotid and radial arteries.;Review the importance of being able to check your own pulse for safety during independent exercise       Expected Outcomes  Short Term: Able to explain why pulse checking is  important during independent exercise;Long Term: Able to check pulse independently and accurately       Understanding of Exercise Prescription  Yes       Intervention  Provide education, explanation, and written materials on patient's individual exercise prescription       Expected Outcomes  Short Term: Able to explain program exercise prescription;Long Term: Able to explain home exercise prescription to exercise independently          Exercise Goals Re-Evaluation : Exercise Goals Re-Evaluation    Row Name 01/25/18 1036 02/21/18 0716 03/25/18 0902         Exercise Goal Re-Evaluation   Exercise Goals Review  Increase Physical Activity;Understanding of Exercise Prescription;Increase Strength and Stamina;Knowledge and understanding of Target Heart Rate Range (THRR);Able to understand and use rate of perceived exertion (RPE) scale;Able to check pulse independently  Increase Physical Activity;Understanding of Exercise Prescription  Increase Physical Activity;Understanding of Exercise Prescription     Comments  Reviewed HEP with pt. ALso reviewed THRR, RPE Scale, weather precautions, endpoints of exercise, NTG use, warmup and cool down.   Pt states he is enjoying rehab. Pt is responding well to workload increases. Pt's average MET level has incresed 1 MET level to 5.78. Pt works long work hours, but still sets time aside to exercise on his own.   One of pt's goal was to increase strength and stamina. Pt is now working at a level 6 on [place] and level 4 on [place]. Pt puts forth great effort with exercise while in rehab.      Expected Outcomes  Pt will continue to walk daily for exercise 30-45 minutes. Pt will continue to increase cardiorespiratory fitness and stamina. Will continue to  monitor and progress pt as tolerated.   Pt will continue to walk daily for exercise 30-45 minutes. Will continue to work with pt to increase workloads.   Pt is continuing to increase stamina. Pt will continue to walk 3-4 days as week for 30-45 minutes. Will continue to monitor pt.         Discharge Exercise Prescription (Final Exercise Prescription Changes): Exercise Prescription Changes - 03/25/18 0900      Response to Exercise   Blood Pressure (Admit)  144/66    Blood Pressure (Exercise)  144/80    Blood Pressure (Exit)  138/64    Heart Rate (Admit)  62 bpm    Heart Rate (Exercise)  111 bpm    Heart Rate (Exit)  62 bpm    Rating of Perceived Exertion (Exercise)  12    Perceived Dyspnea (Exercise)  0    Symptoms  None    Comments  None    Duration  Continue with 30 min of aerobic exercise without signs/symptoms of physical distress.    Intensity  THRR unchanged      Progression   Progression  Continue to progress workloads to maintain intensity without signs/symptoms of physical distress.    Average METs  5.3      Resistance Training   Training Prescription  No      Interval Training   Interval Training  No      Recumbant Bike   Level  4    Minutes  10    METs  6.2      NuStep   Level  6    SPM  115    Minutes  10    METs  5.4      Track   Laps  18  Minutes  10    METs  4.41      Home Exercise Plan   Plans to continue exercise at  Home (comment)   Walking   Frequency  Add 3 additional days to program exercise sessions.    Initial Home Exercises Provided  01/25/18       Nutrition:  Target Goals: Understanding of nutrition guidelines, daily intake of sodium <1520m, cholesterol <2039m calories 30% from fat and 7% or less from saturated fats, daily to have 5 or more servings of fruits and vegetables.  Biometrics: Pre Biometrics - 01/03/18 1014      Pre Biometrics   Height  _0  (1.727 m)    Weight  84.2 kg    Waist Circumference  38.5 inches    Hip  Circumference  40 inches    Waist to Hip Ratio  0.96 %    BMI (Calculated)  28.23    Triceps Skinfold  27 mm    % Body Fat  29.5 %    Grip Strength  42 kg    Flexibility  15 in    Single Leg Stand  30 seconds        Nutrition Therapy Plan and Nutrition Goals: Nutrition Therapy & Goals - 01/03/18 0849      Nutrition Therapy   Diet  consistent carbohydrate heart healthy      Personal Nutrition Goals   Nutrition Goal  Pt to identify food quantities necessary to achieve weight loss of 6-24 lbs. at graduation from cardiac rehab    Personal Goal #2  Pt to identify and limit food sources of saturated fat, trans fat, sodium, refined carbohydrates    Personal Goal #3  Pt to decrease frequency of eating out      Intervention Plan   Intervention  Prescribe, educate and counsel regarding individualized specific dietary modifications aiming towards targeted core components such as weight, hypertension, lipid management, diabetes, heart failure and other comorbidities.    Expected Outcomes  Short Term Goal: Understand basic principles of dietary content, such as calories, fat, sodium, cholesterol and nutrients.       Nutrition Assessments: Nutrition Assessments - 01/03/18 0900      MEDFICTS Scores   Pre Score  --   did not complete      Nutrition Goals Re-Evaluation: Nutrition Goals Re-Evaluation    [place] 01/03/18 0849             Goals   Current Weight  185 lb 10 oz (84.2 kg)          Nutrition Goals Re-Evaluation: Nutrition Goals Re-Evaluation    [place] 01/03/18 0849             Goals   Current Weight  185 lb 10 oz (84.2 kg)          Nutrition Goals Discharge (Final Nutrition Goals Re-Evaluation): Nutrition Goals Re-Evaluation - 01/03/18 0849      Goals   Current Weight  185 lb 10 oz (84.2 kg)       Psychosocial: Target Goals: Acknowledge presence or absence of significant depression and/or stress, maximize coping skills, provide positive support  system. Participant is able to verbalize types and ability to use techniques and skills needed for reducing stress and depression.  Initial Review & Psychosocial Screening: Initial Psych Review & Screening - 01/03/18 0958      Initial Review   Current issues with  None Identified      Family Dynamics  Good Support System?  Yes   Sriyan lists his spouse as a source of support.     Barriers   Psychosocial barriers to participate in program  There are no identifiable barriers or psychosocial needs.      Screening Interventions   Interventions  Encouraged to exercise       Quality of Life Scores: Quality of Life - 01/03/18 0957      Quality of Life   Select  Quality of Life      Quality of Life Scores   Health/Function Pre  27.4 %    Socioeconomic Pre  25.57 %    Psych/Spiritual Pre  26.36 %    Family Pre  30 %    GLOBAL Pre  27.19 %      Scores of 19 and below usually indicate a poorer quality of life in these areas.  A difference of  2-3 points is a clinically meaningful difference.  A difference of 2-3 points in the total score of the Quality of Life Index has been associated with significant improvement in overall quality of life, self-image, physical symptoms, and general health in studies assessing change in quality of life.  PHQ-9: Recent Review Flowsheet Data    Depression screen [facility] 2/9 01/07/2018   Decreased Interest 0   Down, Depressed, Hopeless 0   PHQ - 2 Score 0     Interpretation of Total Score  Total Score Depression Severity:  1-4 = Minimal depression, 5-9 = Mild depression, 10-14 = Moderate depression, 15-19 = Moderately severe depression, 20-27 = Severe depression   Psychosocial Evaluation and Intervention: Psychosocial Evaluation - 01/07/18 0804      Psychosocial Evaluation & Interventions   Interventions  Encouraged to exercise with the program and follow exercise prescription    Comments  no psychosocial needs identified, no interventions  necessary.  pt enjoys sailing and racing cars. pt presently is unable to participate in these activities. pt owns [employer].     Expected Outcomes  pt will exhibit positive outlook with good coping skills.     Continue Psychosocial Services   No Follow up required       Psychosocial Re-Evaluation: Psychosocial Re-Evaluation    [place] Name 01/25/18 1444 02/28/18 4818 03/22/18 0942         Psychosocial Re-Evaluation   Current issues with  None Identified  None Identified  None Identified     Comments  No psychosocial interventions identified.   No psychosocial interventions identified.   No psychosocial interventions identified.      Expected Outcomes  Anthoney will maintain a positive outlook with good coping skills.  Hobert will maintain a positive outlook with good coping skills.  Loney will maintain a positive outlook with good coping skills.     Interventions  Encouraged to attend Cardiac Rehabilitation for the exercise  Encouraged to attend Cardiac Rehabilitation for the exercise  Encouraged to attend Cardiac Rehabilitation for the exercise     Continue Psychosocial Services   No Follow up required  No Follow up required  No Follow up required        Psychosocial Discharge (Final Psychosocial Re-Evaluation): Psychosocial Re-Evaluation - 03/22/18 5631      Psychosocial Re-Evaluation   Current issues with  None Identified    Comments  No psychosocial interventions identified.     Expected Outcomes  Dajion will maintain a positive outlook with good coping skills.    Interventions  Encouraged to attend Cardiac Rehabilitation  for the exercise    Continue Psychosocial Services   No Follow up required       Vocational Rehabilitation: Provide vocational rehab assistance to qualifying candidates.   Vocational Rehab Evaluation & Intervention: Vocational Rehab - 01/03/18 0957      Initial Vocational Rehab Evaluation & Intervention   Assessment  shows need for Vocational Rehabilitation  No       Education: Education Goals: Education classes will be provided on a weekly basis, covering required topics. Participant will state understanding/return demonstration of topics presented.  Learning Barriers/Preferences: Learning Barriers/Preferences - 01/03/18 1015      Learning Barriers/Preferences   Learning Barriers  None    Learning Preferences  Skilled Demonstration;Pictoral       Education Topics: Count Your Pulse:  -Group instruction provided by verbal instruction, demonstration, patient participation and written materials to support subject.  Instructors address importance of being able to find your pulse and how to count your pulse when at home without a heart monitor.  Patients get hands on experience counting their pulse with staff help and individually.   Heart Attack, Angina, and Risk Factor Modification:  -Group instruction provided by verbal instruction, video, and written materials to support subject.  Instructors address signs and symptoms of angina and heart attacks.    Also discuss risk factors for heart disease and how to make changes to improve heart health risk factors.   Functional Fitness:  -Group instruction provided by verbal instruction, demonstration, patient participation, and written materials to support subject.  Instructors address safety measures for doing things around the house.  Discuss how to get up and down off the floor, how to pick things up properly, how to safely get out of a chair without assistance, and balance training.   Meditation and Mindfulness:  -Group instruction provided by verbal instruction, patient participation, and written materials to support subject.  Instructor addresses importance of mindfulness and meditation practice to help reduce stress and improve awareness.  Instructor also leads participants through a meditation exercise.    Stretching for Flexibility and Mobility:   -Group instruction provided by verbal instruction, patient participation, and written materials to support subject.  Instructors lead participants through series of stretches that are designed to increase flexibility thus improving mobility.  These stretches are additional exercise for major muscle groups that are typically performed during regular warm up and cool down.   Hands Only CPR:  -Group verbal, video, and participation provides a basic overview of AHA guidelines for community CPR. Role-play of emergencies allow participants the opportunity to practice calling for help and chest compression technique with discussion of AED use.   Hypertension: -Group verbal and written instruction that provides a basic overview of hypertension including the most recent diagnostic guidelines, risk factor reduction with self-care instructions and medication management.    Nutrition I class: Heart Healthy Eating:  -Group instruction provided by PowerPoint slides, verbal discussion, and written materials to support subject matter. The instructor gives an explanation and review of the Therapeutic Lifestyle Changes diet recommendations, which includes a discussion on lipid goals, dietary fat, sodium, fiber, plant stanol/sterol esters, sugar, and the components of a well-balanced, healthy diet.   Nutrition II class: Lifestyle Skills:  -Group instruction provided by PowerPoint slides, verbal discussion, and written materials to support subject matter. The instructor gives an explanation and review of label reading, grocery shopping for heart health, heart healthy recipe modifications, and ways to make healthier choices when eating out.   Diabetes Question &  Answer:  -Group instruction provided by PowerPoint slides, verbal discussion, and written materials to support subject matter. The instructor gives an explanation and review of diabetes co-morbidities, pre- and post-prandial blood glucose goals,  pre-exercise blood glucose goals, signs, symptoms, and treatment of hypoglycemia and hyperglycemia, and foot care basics.   Diabetes Blitz:  -Group instruction provided by PowerPoint slides, verbal discussion, and written materials to support subject matter. The instructor gives an explanation and review of the physiology behind type 1 and type 2 diabetes, diabetes medications and rational behind using different medications, pre- and post-prandial blood glucose recommendations and Hemoglobin A1c goals, diabetes diet, and exercise including blood glucose guidelines for exercising safely.    Portion Distortion:  -Group instruction provided by PowerPoint slides, verbal discussion, written materials, and food models to support subject matter. The instructor gives an explanation of serving size versus portion size, changes in portions sizes over the last 20 years, and what consists of a serving from each food group.   Stress Management:  -Group instruction provided by verbal instruction, video, and written materials to support subject matter.  Instructors review role of stress in heart disease and how to cope with stress positively.     Exercising on Your Own:  -Group instruction provided by verbal instruction, power point, and written materials to support subject.  Instructors discuss benefits of exercise, components of exercise, frequency and intensity of exercise, and end points for exercise.  Also discuss use of nitroglycerin and activating EMS.  Review options of places to exercise outside of rehab.  Review guidelines for sex with heart disease.   Cardiac Drugs I:  -Group instruction provided by verbal instruction and written materials to support subject.  Instructor reviews cardiac drug classes: antiplatelets, anticoagulants, beta blockers, and statins.  Instructor discusses reasons, side effects, and lifestyle considerations for each drug class.   Cardiac Drugs II:  -Group instruction  provided by verbal instruction and written materials to support subject.  Instructor reviews cardiac drug classes: angiotensin converting enzyme inhibitors (ACE-I), angiotensin II receptor blockers (ARBs), nitrates, and calcium channel blockers.  Instructor discusses reasons, side effects, and lifestyle considerations for each drug class.   Anatomy and Physiology of the Circulatory System:  Group verbal and written instruction and models provide basic cardiac anatomy and physiology, with the coronary electrical and arterial systems. Review of: AMI, Angina, Valve disease, Heart Failure, Peripheral Artery Disease, Cardiac Arrhythmia, Pacemakers, and the ICD.   Other Education:  -Group or individual verbal, written, or video instructions that support the educational goals of the cardiac rehab program.   Holiday Eating Survival Tips:  -Group instruction provided by PowerPoint slides, verbal discussion, and written materials to support subject matter. The instructor gives patients tips, tricks, and techniques to help them not only survive but enjoy the holidays despite the onslaught of food that accompanies the holidays.   Knowledge Questionnaire Score: Knowledge Questionnaire Score - 01/03/18 0957      Knowledge Questionnaire Score   Pre Score  22/24       Core Components/Risk Factors/Patient Goals at Admission: Personal Goals and Risk Factors at Admission - 01/03/18 1017      Core Components/Risk Factors/Patient Goals on Admission    Weight Management  Yes;Weight Loss    Intervention  Weight Management: Develop a combined nutrition and exercise program designed to reach desired caloric intake, while maintaining appropriate intake of nutrient and fiber, sodium and fats, and appropriate energy expenditure required for the weight goal.;Weight Management: Provide education and appropriate resources  to help participant work on and attain dietary goals.;Weight Management/Obesity: Establish  reasonable short term and long term weight goals.;Obesity: Provide education and appropriate resources to help participant work on and attain dietary goals.    Admit Weight  185 lb 10 oz (84.2 kg)    Goal Weight: Short Term  180 lb (81.6 kg)    Goal Weight: Long Term  175 lb (79.4 kg)    Expected Outcomes  Short Term: Continue to assess and modify interventions until short term weight is achieved;Long Term: Adherence to nutrition and physical activity/exercise program aimed toward attainment of established weight goal;Weight Loss: Understanding of general recommendations for a balanced deficit meal plan, which promotes 1-2 lb weight loss per week and includes a negative energy balance of [phone] kcal/d;Understanding recommendations for meals to include 15-35% energy as protein, 25-35% energy from fat, 35-60% energy from carbohydrates, less than 233m of dietary cholesterol, 20-35 gm of total fiber daily;Understanding of distribution of calorie intake throughout the day with the consumption of 4-5 meals/snacks    Hypertension  Yes    Intervention  Provide education on lifestyle modifcations including regular physical activity/exercise, weight management, moderate sodium restriction and increased consumption of fresh fruit, vegetables, and low fat dairy, alcohol moderation, and smoking cessation.;Monitor prescription use compliance.    Expected Outcomes  Short Term: Continued assessment and intervention until BP is < 140/93mHG in hypertensive participants. < 130/8060mG in hypertensive participants with diabetes, heart failure or chronic kidney disease.;Long Term: Maintenance of blood pressure at goal levels.    Lipids  Yes    Intervention  Provide education and support for participant on nutrition & aerobic/resistive exercise along with prescribed medications to achieve LDL <18m82mDL >40mg68m Expected Outcomes  Short Term: Participant states understanding of desired cholesterol values and is compliant  with medications prescribed. Participant is following exercise prescription and nutrition guidelines.;Long Term: Cholesterol controlled with medications as prescribed, with individualized exercise RX and with personalized nutrition plan. Value goals: LDL < 18mg,53m > 40 mg.    Stress  Yes    Intervention  Offer individual and/or small group education and counseling on adjustment to heart disease, stress management and health-related lifestyle change. Teach and support self-help strategies.;Refer participants experiencing significant psychosocial distress to appropriate mental health specialists for further evaluation and treatment. When possible, include family members and significant others in education/counseling sessions.    Expected Outcomes  Short Term: Participant demonstrates changes in health-related behavior, relaxation and other stress management skills, ability to obtain effective social support, and compliance with psychotropic medications if prescribed.;Long Term: Emotional wellbeing is indicated by absence of clinically significant psychosocial distress or social isolation.       Core Components/Risk Factors/Patient Goals Review:  Goals and Risk Factor Review    Row Name 01/07/18 0811 01/25/18 1445 02/28/18 0821 03/22/18 0942       Core Components/Risk Factors/Patient Goals Review   Personal Goals Review  Weight Management/Obesity;Hypertension;Lipids;Stress  Weight Management/Obesity;Hypertension;Lipids;Stress  Weight Management/Obesity;Hypertension;Lipids;Stress  Weight Management/Obesity;Hypertension;Lipids;Stress    Review  pt with multiple CAD RF demonstrates eagerness to participate in CR program. pt personal goals are to lose weight, 170lb target weight, and develop exercise routine.   Pt with multiple CAD RF willling to participate in CR program. He is tolerating exercise well.  Pt with multiple CAD RF willling to participate in CR program. He is tolerating exercise well.  VSS.    Pt with multiple CAD RF willling to participate in CR program. He  is tolerating exercise well.  VSS. Pt is enjoying the exercise routine.     Expected Outcomes  pt will participate in CR exercise, nutrition and lifestyle modification activities.   pt will participate in CR exercise, nutrition and lifestyle modification activities.   Beauden will participate in CR exercise, nutrition and lifestyle modification activities.   Mohab will participate in CR exercise, nutrition and lifestyle modification activities.        Core Components/Risk Factors/Patient Goals at Discharge (Final Review):  Goals and Risk Factor Review - 03/22/18 0942      Core Components/Risk Factors/Patient Goals Review   Personal Goals Review  Weight Management/Obesity;Hypertension;Lipids;Stress    Review  Pt with multiple CAD RF willling to participate in CR program. He is tolerating exercise well.  VSS. Pt is enjoying the exercise routine.     Expected Outcomes  Bayden will participate in CR exercise, nutrition and lifestyle modification activities.        ITP Comments: ITP Comments    Row Name 01/03/18 0752 01/07/18 0802 01/25/18 1443 02/28/18 0816 03/22/18 0940   ITP Comments  Dr. Fransico Him, Medical Director   pt started group exercise.  pt tolerated light activity without diffculty. pt oriented to exercise equipment and safety routine.   30 Day ITP Review. Ryott is tolerating exercise well with his recent start in the CR Program. His vital signs are stable.   30 Day ITP Review. Geoff is tolerating his workloads well with exercise. VSS.  30 Day ITP Review. Charistopher is tolerating his workloads well with exercise. He is enjoying the exercise routine.       Comments: See ITP Comments.

## 2018-03-29 ENCOUNTER — Encounter (HOSPITAL_COMMUNITY): Payer: PPO

## 2018-03-29 ENCOUNTER — Encounter (HOSPITAL_COMMUNITY)
Admission: RE | Admit: 2018-03-29 | Discharge: 2018-03-29 | Disposition: A | Discharge status: Home / Self Care | Payer: PPO | Source: Ambulatory Visit | Attending: Cardiology | Admitting: Cardiology

## 2018-03-29 DIAGNOSIS — Z955 Presence of coronary angioplasty implant and graft: Secondary | ICD-10-CM

## 2018-04-01 ENCOUNTER — Encounter (HOSPITAL_COMMUNITY)
Admission: RE | Admit: 2018-04-01 | Discharge: 2018-04-01 | Disposition: A | Discharge status: Home / Self Care | Payer: PPO | Source: Ambulatory Visit | Attending: Cardiology | Admitting: Cardiology

## 2018-04-01 ENCOUNTER — Encounter (HOSPITAL_COMMUNITY): Payer: PPO

## 2018-04-01 VITALS — Ht 68.0 in | Wt 183.9 lb

## 2018-04-01 DIAGNOSIS — Z955 Presence of coronary angioplasty implant and graft: Secondary | ICD-10-CM

## 2018-04-02 DIAGNOSIS — S63283D Dislocation of proximal interphalangeal joint of left middle finger, subsequent encounter: Secondary | ICD-10-CM | POA: Diagnosis not present

## 2018-04-02 DIAGNOSIS — S56494D Other injury of extensor muscle, fascia and tendon of left middle finger at forearm level, subsequent encounter: Secondary | ICD-10-CM | POA: Diagnosis not present

## 2018-04-03 ENCOUNTER — Encounter (HOSPITAL_COMMUNITY): Payer: PPO

## 2018-04-05 ENCOUNTER — Encounter (HOSPITAL_COMMUNITY): Payer: PPO

## 2018-04-08 ENCOUNTER — Encounter (HOSPITAL_COMMUNITY): Payer: PPO

## 2018-04-08 ENCOUNTER — Encounter (HOSPITAL_COMMUNITY)
Admission: RE | Admit: 2018-04-08 | Discharge: 2018-04-08 | Disposition: A | Discharge status: Home / Self Care | Payer: PPO | Source: Ambulatory Visit | Attending: Cardiology | Admitting: Cardiology

## 2018-04-08 DIAGNOSIS — Z955 Presence of coronary angioplasty implant and graft: Secondary | ICD-10-CM

## 2018-04-08 DIAGNOSIS — S56494D Other injury of extensor muscle, fascia and tendon of left middle finger at forearm level, subsequent encounter: Secondary | ICD-10-CM | POA: Diagnosis not present

## 2018-04-08 DIAGNOSIS — S63282D Dislocation of proximal interphalangeal joint of right middle finger, subsequent encounter: Secondary | ICD-10-CM | POA: Diagnosis not present

## 2018-04-10 ENCOUNTER — Encounter (HOSPITAL_COMMUNITY)
Admission: RE | Admit: 2018-04-10 | Discharge: 2018-04-10 | Disposition: A | Discharge status: Home / Self Care | Payer: PPO | Source: Ambulatory Visit | Attending: Cardiology | Admitting: Cardiology

## 2018-04-10 ENCOUNTER — Encounter (HOSPITAL_COMMUNITY): Payer: PPO

## 2018-04-10 ENCOUNTER — Encounter (HOSPITAL_COMMUNITY): Payer: Self-pay

## 2018-04-10 DIAGNOSIS — Z955 Presence of coronary angioplasty implant and graft: Secondary | ICD-10-CM

## 2018-04-10 NOTE — Progress Notes (Signed)
Cardiology Office Note   Date:  04/11/2018   ID:  Marijean Heath, DOB 26-Apr-1946, MRN [id number]  PCP:  Marton Redwood, MD  Cardiologist:   Minus Breeding, MD   Chief Complaint  Patient presents with  . Coronary Artery Disease  . Atrial Fibrillation     History of Present Illness: Matthew Briggs is a 72 y.o. male who presents for evaluation follow up of calcium score that was greater than 800 and 89% for his age.  He had a stress perfusion study in 2016 that demonstrated a fixed defect in the apical region.  The study was not gated secondary to PVCs. He had a POET (Plain Old Exercise Treadmill) in Jan 2018 that demonstrated no ischemia.  He had another this January and there were increased PVCs.  I sent him for a CTA.  This suggested severe stenosis and subtotal occlusion of the mid circumflex with probably mild disease in the LAD.   By [facility] there appeared to be hemodynamically significant first diagonal stenosis.  He had cath in May and was found to have severe 2 vessel disease and he had a drug eluding stent to the circ.   He subsequently was found to have atrial fib.  He did have an echo which demonstrated a normal EF.  He has been on blood thinner.    Since I last saw him he has just completed cardiac rehab.  The patient denies any new symptoms such as chest discomfort, neck or arm discomfort. There has been no new shortness of breath, PND or orthopnea. There have been no reported palpitations, presyncope or syncope.  He needs to have finger surgery.   Of note I do see an EKG from rehab on October 21 when he was in flutter with a rate of 118.  He never felt this.  He is back in sinus today.   Past Medical History:  Diagnosis Date  . Abnormal computed tomography angiography (CTA) 10/05/2017  . Abnormal MRI   . Arthritis    "hands" (10/15/2017)  . Atrial fibrillation ([place])   . Coronary artery disease involving native heart 10/15/2017  . Essential hypertension 02/02/2015  .  Hyperlipidemia with target LDL less than 70 02/02/2015  . Hypogonadism, male   . Laceration of chest wall 08/08/2017  . PVC's (premature ventricular contractions) 01/27/2017    Past Surgical History:  Procedure Laterality Date  . CARPAL TUNNEL RELEASE Left 04/2017  . COLONOSCOPY    . CORONARY STENT INTERVENTION N/A 10/15/2017   Procedure: CORONARY STENT INTERVENTION;  Surgeon: Leonie Man, MD;  Location: [place] [facility];  Service: Cardiovascular;  Laterality: N/A;  . FRACTURE SURGERY    . INGUINAL HERNIA REPAIR Right ~ 1970  . IRRIGATION AND DEBRIDEMENT ABSCESS  08/08/2017   Procedure: IRRIGATION AND DEBRIDEMENT OF CHEST;  Surgeon: Judeth Horn, MD;  Location: [place];  Service: General;;  . LEFT HEART CATH AND CORONARY ANGIOGRAPHY N/A 10/15/2017   Procedure: LEFT HEART CATH AND CORONARY ANGIOGRAPHY;  Surgeon: Leonie Man, MD;  Location: [place] [facility];  Service: Cardiovascular;  Laterality: N/A;  . MINOR IRRIGATION AND DEBRIDEMENT OF WOUND Left 08/08/2017   Procedure: CLOSED REDUCTION REPAIR OF PIP JOINT/LACERATION LEFT MIDDLE FINGER;  Surgeon: Milly Jakob, MD;  Location: [place];  Service: Orthopedics;  Laterality: Left;  . OPEN REDUCTION INTERNAL FIXATION (ORIF) DISTAL PHALANX Left 08/13/2017   Procedure: OPEN TREATMENT OF LEFT LONG FINGER OPEN JOINT DISLOCATION OF PROXIMAL PHALANYX WITH EXTENSOR TENDON  REPAIR;  Surgeon: Milly Jakob, MD;  Location: [place];  Service: Orthopedics;  Laterality: Left;  . REPAIR EXTENSOR TENDON Left 08/13/2017   Procedure: REPAIR EXTENSOR TENDON;  Surgeon: Milly Jakob, MD;  Location: [place];  Service: Orthopedics;  Laterality: Left;     Current Outpatient Medications  Medication Sig Dispense Refill  . acetaminophen (TYLENOL) 325 MG tablet Take 2 tablets (650 mg total) by mouth every 6 (six) hours.    . Calcium-Phosphorus-Vitamin D (CITRACAL +D3 PO) Take 1 tablet by mouth daily with supper.    .  cholecalciferol (VITAMIN D) 1000 UNITS tablet Take 1,000 Units by mouth daily with supper.     . clopidogrel (PLAVIX) 75 MG tablet Take 1 tablet (75 mg total) by mouth daily with breakfast. (Patient taking differently: Take 75 mg by mouth at bedtime. ) 90 tablet 3  . Coenzyme Q10 (CO Q 10) 100 MG CAPS Take 100 mg by mouth daily with supper. Qunol CoQ10    . diclofenac sodium (VOLTAREN) 1 % GEL Apply 2-4 g topically 4 (four) times daily as needed (for arthritis pain).    Javier Docker Oil 500 MG CAPS Take 500 mg by mouth daily with supper.    . Multiple Vitamin (MULTIVITAMIN WITH MINERALS) TABS tablet Take 1 tablet by mouth daily with supper.     . Potassium 99 MG TABS Take 99 mg by mouth daily with supper.    . quiNINE (QUALAQUIN) 324 MG capsule Take 648 mg by mouth daily as needed (for cramps.).     Marland Kitchen rivaroxaban (XARELTO) 20 MG TABS tablet Take 1 tablet (20 mg total) by mouth daily with supper. 30 tablet 3  . rosuvastatin (CRESTOR) 20 MG tablet Take 1 tablet (20 mg total) by mouth at bedtime. 90 tablet 3  . traMADol (ULTRAM) 50 MG tablet Take 1 tablet (50 mg total) by mouth every 6 (six) hours as needed (mild pain). 20 tablet 0  . valsartan-hydrochlorothiazide (DIOVAN HCT) 160-12.5 MG tablet Take 1 tablet by mouth daily. 30 tablet 3   No current facility-administered medications for this visit.     Allergies:   Patient has no known allergies.    ROS:  Please see the history of present illness.   Otherwise, review of systems are positive for none.   All other systems are reviewed and negative.    PHYSICAL EXAM: VS:  BP (!) 148/76   Pulse 60   Ht 5\' 8"  (1.727 m)   Wt 186 lb 12.8 oz (84.7 kg)   BMI 28.40 kg/m  , BMI Body mass index is 28.4 kg/m.  GENERAL:  Well appearing NECK:  No jugular venous distention, waveform within normal limits, carotid upstroke brisk and symmetric, no bruits, no thyromegaly LUNGS:  Clear to auscultation bilaterally CHEST:  Unremarkable HEART:  PMI not displaced  or sustained,S1 and S2 within normal limits, no S3, no S4, no clicks, no rubs, no murmurs ABD:  Flat, positive bowel sounds normal in frequency in pitch, no bruits, no rebound, no guarding, no midline pulsatile mass, no hepatomegaly, no splenomegaly EXT:  2 plus pulses throughout, no edema, no cyanosis no clubbing   EKG:  EKG is ordered today. Sinus rhythm, rate 60, axis within normal limits, intervals within normal limits, poor anterior R wave progression.   Recent Labs: 10/08/2017: TSH 3.020 10/16/2017: Hemoglobin 13.3; Platelets 154 11/28/2017: ALT 39; BUN 14; Creatinine, Ser 0.82; Magnesium 2.1; Potassium 4.1; Sodium 138    Lipid Panel  Component Value Date/Time   CHOL 140 11/28/2017 1201   TRIG 124 11/28/2017 1201   HDL 64 11/28/2017 1201   CHOLHDL 2.2 11/28/2017 1201   LDLCALC 51 11/28/2017 1201      Wt Readings from Last 3 Encounters:  04/11/18 186 lb 12.8 oz (84.7 kg)  04/01/18 183 lb 13.8 oz (83.4 kg)  01/07/18 185 lb 6.4 oz (84.1 kg)      Other studies Reviewed: Additional studies/ records that were reviewed today include:    EKG Review of the above records demonstrates:      ASSESSMENT AND PLAN:   PREOP:   I spoke with his surgeon.  He can have his surgery on the Plavix.  I would discontinue the Xarelto 2 days prior to surgery.  I would have him resume the Xarelto when it is safe from a surgical standpoint.  The patient has no high risk symptoms.  It is not a high risk surgery.  Therefore, according to ACC/AHA guidelines the patient is at acceptable risk for the planned procedure.  CAD:    The patient has no new sypmtoms.  No further cardiovascular testing is indicated.  We will continue with aggressive risk reduction and meds as listed.  ATRIAL FIB:  Mr. Tomy Khim has a CHA2DS2 - VASc score of 3.  He will remain on Xarelto.  He has had paroxysms but he does not feel these.  We talked about getting a fit bit so that he could watch his heart rate for a  few weeks at least continuously and maybe we can judge the burden of this.  I think it is infrequent.    CAD:   The patient has no new sypmtoms.  No further cardiovascular testing is indicated.  We will continue with aggressive risk reduction and meds as listed.  PULMONARY NODULE:   I will order a CT for April.     DYSLIPIDEMIA: LDL was 59, HDL 61.  No change in therapy.  PVCs:   He does not feel this.  No change in therapy.   HTN:    The blood pressure is very slightly elevated today but he says this is unusual in its tract at rehab and comes down easily.  He was rushing to get here.  He can keep a blood pressure diary.  No change in therapy.    Current medicines are reviewed at length with the patient today.  The patient does not have concerns regarding medicines  The following changes have been made:  None  Labs/ tests ordered today include:   None Orders Placed This Encounter  Procedures  . CT Chest Wo Contrast  . EKG 12-Lead     Disposition:   FU with me in April.   Signed, Minus Breeding, MD  04/11/2018 9:24 AM    [facility]

## 2018-04-11 ENCOUNTER — Encounter: Payer: Self-pay | Admitting: Cardiology

## 2018-04-11 ENCOUNTER — Ambulatory Visit (INDEPENDENT_AMBULATORY_CARE_PROVIDER_SITE_OTHER): Payer: PPO | Admitting: Cardiology

## 2018-04-11 VITALS — BP 148/76 | HR 60 | Ht 68.0 in | Wt 186.8 lb

## 2018-04-11 DIAGNOSIS — I480 Paroxysmal atrial fibrillation: Secondary | ICD-10-CM | POA: Diagnosis not present

## 2018-04-11 DIAGNOSIS — I10 Essential (primary) hypertension: Secondary | ICD-10-CM

## 2018-04-11 DIAGNOSIS — Z01810 Encounter for preprocedural cardiovascular examination: Secondary | ICD-10-CM | POA: Diagnosis not present

## 2018-04-11 DIAGNOSIS — R918 Other nonspecific abnormal finding of lung field: Secondary | ICD-10-CM | POA: Diagnosis not present

## 2018-04-11 DIAGNOSIS — E785 Hyperlipidemia, unspecified: Secondary | ICD-10-CM

## 2018-04-11 DIAGNOSIS — I2510 Atherosclerotic heart disease of native coronary artery without angina pectoris: Secondary | ICD-10-CM | POA: Diagnosis not present

## 2018-04-11 NOTE — Patient Instructions (Signed)
Medication Instructions:  STOP- Xarelto 2 days prior to procedure  If you need a refill on your cardiac medications before your next appointment, please call your pharmacy.  Labwork: None Ordered   If you have labs (blood work) drawn today and your tests are completely normal, you will receive your results only by: Marland Kitchen MyChart Message (if you have MyChart) OR . A paper copy in the mail If you have any lab test that is abnormal or we need to change your treatment, we will call you to review the results.  Testing/Procedures: Non-Cardiac CT scanning, (CAT scanning), is a noninvasive, special x-ray that produces cross-sectional images of the body using x-rays and a computer. CT scans help physicians diagnose and treat medical conditions. For some CT exams, a contrast material is used to enhance visibility in the area of the body being studied. CT scans provide greater clarity and reveal more details than regular x-ray exams. In April 2020  Follow-Up: . You will need a follow up appointment in April 2020.   At [facility], you and your health needs are our priority.  As part of our continuing mission to provide you with exceptional heart care, we have created designated Provider Care Teams.  These Care Teams include your primary Cardiologist (physician) and Advanced Practice Providers (APPs -  Physician Assistants and Nurse Practitioners) who all work together to provide you with the care you need, when you need it.   Thank you for choosing [facility] at [facility]!!

## 2018-04-12 DIAGNOSIS — Z1212 Encounter for screening for malignant neoplasm of rectum: Secondary | ICD-10-CM | POA: Diagnosis not present

## 2018-04-18 NOTE — Progress Notes (Signed)
Discharge Progress Report  Patient Details  Name: Matthew Briggs MRN: [id number] Date of Birth: Mar 12, 1946 Referring Provider:     CARDIAC REHAB PHASE II ORIENTATION from 01/03/2018 in [place]  Referring Provider  Minus Breeding MD        Number of Visits: 29  Reason for Discharge:  Patient reached a stable level of exercise. Patient independent in their exercise. Patient has met program and personal goals.  Smoking History:  Social History   Tobacco Use  Smoking Status Never Smoker  Smokeless Tobacco Never Used    Diagnosis:  Status post coronary artery stent placement  ADL UCSD:   Initial Exercise Prescription: Initial Exercise Prescription - 01/03/18 1000      Date of Initial Exercise RX and Referring Provider   Date  01/03/18    Referring Provider  Minus Breeding MD     Expected Discharge Date  03/29/18      Recumbant Bike   Level  2    Watts  30    Minutes  10    METs  3.11      NuStep   Level  3    SPM  75    Minutes  10    METs  2.5      Track   Laps  12    Minutes  10    METs  3.07      Prescription Details   Frequency (times per week)  3x    Duration  Progress to 30 minutes of continuous aerobic without signs/symptoms of physical distress      Intensity   THRR 40-80% of Max Heartrate  60-120    Ratings of Perceived Exertion  11-13    Perceived Dyspnea  0-4      Progression   Progression  Continue progressive overload as per policy without signs/symptoms or physical distress.      Resistance Training   Training Prescription  Yes    Weight  4lbs    Reps  10-15       Discharge Exercise Prescription (Final Exercise Prescription Changes): Exercise Prescription Changes - 04/10/18 1600      Response to Exercise   Blood Pressure (Admit)  150/72    Blood Pressure (Exercise)  168/60   Recheck: 138/70   Blood Pressure (Exit)  112/62    Heart Rate (Admit)  59 bpm    Heart Rate (Exercise)  111 bpm     Heart Rate (Exit)  62 bpm    Rating of Perceived Exertion (Exercise)  11    Perceived Dyspnea (Exercise)  0    Symptoms  None    Comments  Pt graduated from Cardiac Rehab     Duration  Continue with 45 min of aerobic exercise without signs/symptoms of physical distress.    Intensity  THRR unchanged      Progression   Progression  Continue to progress workloads to maintain intensity without signs/symptoms of physical distress.    Average METs  6.82      Resistance Training   Training Prescription  No      Interval Training   Interval Training  No      Recumbant Bike   Level  4    Minutes  10    METs  7      NuStep   Level  6    SPM  115    Minutes  10    METs  72  Track   Laps  20    Minutes  10    METs  4.45      Home Exercise Plan   Plans to continue exercise at  Maintenance   Walking   Frequency  Add 3 additional days to program exercise sessions.    Initial Home Exercises Provided  01/25/18       Functional Capacity: 6 Minute Walk    Row Name 01/03/18 1005 04/01/18 0811       6 Minute Walk   Phase  Initial  Discharge    Distance  1627 feet  2221 feet    Distance % Change  -  36.51 %    Distance Feet Change  -  594 ft    Walk Time  6 minutes  6 minutes    # of Rest Breaks  0  0    MPH  3.08  4.21    METS  3.24  4.73    RPE  12  13    Perceived Dyspnea   0  0    VO2 Peak  11.4  16.54    Symptoms  No  No    Resting HR  58 bpm  58 bpm    Resting BP  120/70  122/70    Resting Oxygen Saturation   96 %  -    Exercise Oxygen Saturation  during 6 min walk  96 %  -    Max Ex. HR  80 bpm  111 bpm    Max Ex. BP  130/70  146/70    2 Minute Post BP  120/66  118/72       Psychological, QOL, Others - Outcomes: PHQ 2/9: Depression screen [facility] 2/9 04/10/2018 01/07/2018  Decreased Interest 0 0  Down, Depressed, Hopeless 0 0  PHQ - 2 Score 0 0    Quality of Life: Quality of Life - 04/11/18 1613      Quality of Life   Select  Quality of Life       Quality of Life Scores   Health/Function Pre  27.4 %    Health/Function Post  24.63 %    Health/Function % Change  -10.11 %    Socioeconomic Pre  25.57 %    Socioeconomic Post  25.93 %    Socioeconomic % Change   1.41 %    Psych/Spiritual Pre  26.36 %    Psych/Spiritual Post  25.71 %    Psych/Spiritual % Change  -2.47 %    Family Pre  30 %    Family Post  28.8 %    Family % Change  -4 %    GLOBAL Pre  27.19 %    GLOBAL Post  25.74 %    GLOBAL % Change  -5.33 %       Personal Goals: Goals established at orientation with interventions provided to work toward goal. Personal Goals and Risk Factors at Admission - 01/03/18 1017      Core Components/Risk Factors/Patient Goals on Admission    Weight Management  Yes;Weight Loss    Intervention  Weight Management: Develop a combined nutrition and exercise program designed to reach desired caloric intake, while maintaining appropriate intake of nutrient and fiber, sodium and fats, and appropriate energy expenditure required for the weight goal.;Weight Management: Provide education and appropriate resources to help participant work on and attain dietary goals.;Weight Management/Obesity: Establish reasonable short term and long term weight goals.;Obesity: Provide education and appropriate resources to help  participant work on and attain dietary goals.    Admit Weight  185 lb 10 oz (84.2 kg)    Goal Weight: Short Term  180 lb (81.6 kg)    Goal Weight: Long Term  175 lb (79.4 kg)    Expected Outcomes  Short Term: Continue to assess and modify interventions until short term weight is achieved;Long Term: Adherence to nutrition and physical activity/exercise program aimed toward attainment of established weight goal;Weight Loss: Understanding of general recommendations for a balanced deficit meal plan, which promotes 1-2 lb weight loss per week and includes a negative energy balance of [phone] kcal/d;Understanding recommendations for meals to include  15-35% energy as protein, 25-35% energy from fat, 35-60% energy from carbohydrates, less than 277m of dietary cholesterol, 20-35 gm of total fiber daily;Understanding of distribution of calorie intake throughout the day with the consumption of 4-5 meals/snacks    Hypertension  Yes    Intervention  Provide education on lifestyle modifcations including regular physical activity/exercise, weight management, moderate sodium restriction and increased consumption of fresh fruit, vegetables, and low fat dairy, alcohol moderation, and smoking cessation.;Monitor prescription use compliance.    Expected Outcomes  Short Term: Continued assessment and intervention until BP is < 140/981mHG in hypertensive participants. < 130/8020mG in hypertensive participants with diabetes, heart failure or chronic kidney disease.;Long Term: Maintenance of blood pressure at goal levels.    Lipids  Yes    Intervention  Provide education and support for participant on nutrition & aerobic/resistive exercise along with prescribed medications to achieve LDL <80m73mDL >40mg28m Expected Outcomes  Short Term: Participant states understanding of desired cholesterol values and is compliant with medications prescribed. Participant is following exercise prescription and nutrition guidelines.;Long Term: Cholesterol controlled with medications as prescribed, with individualized exercise RX and with personalized nutrition plan. Value goals: LDL < 80mg,27m > 40 mg.    Stress  Yes    Intervention  Offer individual and/or small group education and counseling on adjustment to heart disease, stress management and health-related lifestyle change. Teach and support self-help strategies.;Refer participants experiencing significant psychosocial distress to appropriate mental health specialists for further evaluation and treatment. When possible, include family members and significant others in education/counseling sessions.    Expected Outcomes  Short  Term: Participant demonstrates changes in health-related behavior, relaxation and other stress management skills, ability to obtain effective social support, and compliance with psychotropic medications if prescribed.;Long Term: Emotional wellbeing is indicated by absence of clinically significant psychosocial distress or social isolation.        Personal Goals Discharge: Goals and Risk Factor Review    Row Name 01/07/18 0811 01/25/18 1445 02/28/18 0821 03/22/18 0942 04/10/18 0821     Core Components/Risk Factors/Patient Goals Review   Personal Goals Review  Weight Management/Obesity;Hypertension;Lipids;Stress  Weight Management/Obesity;Hypertension;Lipids;Stress  Weight Management/Obesity;Hypertension;Lipids;Stress  Weight Management/Obesity;Hypertension;Lipids;Stress  Weight Management/Obesity;Hypertension;Lipids;Stress   Review  pt with multiple CAD RF demonstrates eagerness to participate in CR program. pt personal goals are to lose weight, 170lb target weight, and develop exercise routine.   Pt with multiple CAD RF willling to participate in CR program. He is tolerating exercise well.  Pt with multiple CAD RF willling to participate in CR program. He is tolerating exercise well.  VSS.   Pt with multiple CAD RF willling to participate in CR program. He is tolerating exercise well.  VSS. Pt is enjoying the exercise routine.   Pt graduated CR Program with 29 complete sessions.  Matthew Briggs feels that he  has gained an exercise routine.  Matthew Briggs would like to participate in the Maintenance program.    Expected Outcomes  pt will participate in CR exercise, nutrition and lifestyle modification activities.   pt will participate in CR exercise, nutrition and lifestyle modification activities.   Matthew Briggs will participate in CR exercise, nutrition and lifestyle modification activities.   Matthew Briggs will participate in CR exercise, nutrition and lifestyle modification activities.   Matthew Briggs will participate in exercise,  nutrition and lifestyle modification activities.  He plans to participate in cardiac maintenance.      Exercise Goals and Review: Exercise Goals    Row Name 01/03/18 1013             Exercise Goals   Increase Physical Activity  Yes       Intervention  Provide advice, education, support and counseling about physical activity/exercise needs.;Develop an individualized exercise prescription for aerobic and resistive training based on initial evaluation findings, risk stratification, comorbidities and participant's personal goals.       Expected Outcomes  Short Term: Attend rehab on a regular basis to increase amount of physical activity.;Long Term: Add in home exercise to make exercise part of routine and to increase amount of physical activity.;Long Term: Exercising regularly at least 3-5 days a week.       Increase Strength and Stamina  Yes       Intervention  Provide advice, education, support and counseling about physical activity/exercise needs.;Develop an individualized exercise prescription for aerobic and resistive training based on initial evaluation findings, risk stratification, comorbidities and participant's personal goals.       Expected Outcomes  Short Term: Increase workloads from initial exercise prescription for resistance, speed, and METs.;Short Term: Perform resistance training exercises routinely during rehab and add in resistance training at home;Long Term: Improve cardiorespiratory fitness, muscular endurance and strength as measured by increased METs and functional capacity (6MWT)       Able to understand and use rate of perceived exertion (RPE) scale  Yes       Intervention  Provide education and explanation on how to use RPE scale       Expected Outcomes  Long Term:  Able to use RPE to guide intensity level when exercising independently;Short Term: Able to use RPE daily in rehab to express subjective intensity level       Knowledge and understanding of Target Heart Rate  Range (THRR)  Yes       Intervention  Provide education and explanation of THRR including how the numbers were predicted and where they are located for reference       Expected Outcomes  Short Term: Able to state/look up THRR;Short Term: Able to use daily as guideline for intensity in rehab;Long Term: Able to use THRR to govern intensity when exercising independently       Able to check pulse independently  Yes       Intervention  Provide education and demonstration on how to check pulse in carotid and radial arteries.;Review the importance of being able to check your own pulse for safety during independent exercise       Expected Outcomes  Short Term: Able to explain why pulse checking is important during independent exercise;Long Term: Able to check pulse independently and accurately       Understanding of Exercise Prescription  Yes       Intervention  Provide education, explanation, and written materials on patient's individual exercise prescription       Expected Outcomes  Short Term: Able to explain program exercise prescription;Long Term: Able to explain home exercise prescription to exercise independently          Exercise Goals Re-Evaluation: Exercise Goals Re-Evaluation    Row Name 01/25/18 1036 02/21/18 0716 03/25/18 0902 04/11/18 1605       Exercise Goal Re-Evaluation   Exercise Goals Review  Increase Physical Activity;Understanding of Exercise Prescription;Increase Strength and Stamina;Knowledge and understanding of Target Heart Rate Range (THRR);Able to understand and use rate of perceived exertion (RPE) scale;Able to check pulse independently  Increase Physical Activity;Understanding of Exercise Prescription  Increase Physical Activity;Understanding of Exercise Prescription  Increase Physical Activity;Understanding of Exercise Prescription    Comments  Reviewed HEP with pt. ALso reviewed THRR, RPE Scale, weather precautions, endpoints of exercise, NTG use, warmup and cool down.   Pt  states he is enjoying rehab. Pt is responding well to workload increases. Pt's average MET level has incresed 1 MET level to 5.78. Pt works long work hours, but still sets time aside to exercise on his own.   One of pt's goal was to increase strength and stamina. Pt is now working at a level 6 on Nustep and level 4 on [place]. Pt puts forth great effort with exercise while in rehab.   Pt completed 29 sessions of Cardiac Rehab. Pt increased functional capacity by 36.51%. Pt increased post 6MWT distance by 52f. Pt also increased single leg balance test by 5 seconds. Pt's goals was to increase strength and stamina. Pt achieved this goal and is feeling stronger. Pt also improved his work/life balance and is taking measures make more time for his health and exercise.     Expected Outcomes  Pt will continue to walk daily for exercise 30-45 minutes. Pt will continue to increase cardiorespiratory fitness and stamina. Will continue to monitor and progress pt as tolerated.   Pt will continue to walk daily for exercise 30-45 minutes. Will continue to work with pt to increase workloads.   Pt is continuing to increase stamina. Pt will continue to walk 3-4 days as week for 30-45 minutes. Will continue to monitor pt.   Pt will walk for exercise post Cardiac Rehab. Pt is also planning to join maintenance program to continue to exercising on his own.        Nutrition & Weight - Outcomes: Pre Biometrics - 01/03/18 1014      Pre Biometrics   Height  _0  (1.727 m)    Weight  84.2 kg    Waist Circumference  38.5 inches    Hip Circumference  40 inches    Waist to Hip Ratio  0.96 %    BMI (Calculated)  28.23    Triceps Skinfold  27 mm    % Body Fat  29.5 %    Grip Strength  42 kg    Flexibility  15 in    Single Leg Stand  30 seconds      Post Biometrics - 04/01/18 0812       Post  Biometrics   Height  _1  (1.727 m)    Weight  83.4 kg    Waist Circumference  38.5 inches    Hip Circumference  38  inches    Waist to Hip Ratio  1.01 %    BMI (Calculated)  27.96    Triceps Skinfold  25 mm    % Body Fat  29.1 %    Grip Strength  42 kg    Flexibility  15.5 in    Single Leg Stand  35 seconds       Nutrition: Nutrition Therapy & Goals - 01/03/18 0849      Nutrition Therapy   Diet  consistent carbohydrate heart healthy      Personal Nutrition Goals   Nutrition Goal  Pt to identify food quantities necessary to achieve weight loss of 6-24 lbs. at graduation from cardiac rehab    Personal Goal #2  Pt to identify and limit food sources of saturated fat, trans fat, sodium, refined carbohydrates    Personal Goal #3  Pt to decrease frequency of eating out      Intervention Plan   Intervention  Prescribe, educate and counsel regarding individualized specific dietary modifications aiming towards targeted core components such as weight, hypertension, lipid management, diabetes, heart failure and other comorbidities.    Expected Outcomes  Short Term Goal: Understand basic principles of dietary content, such as calories, fat, sodium, cholesterol and nutrients.       Nutrition Discharge: Nutrition Assessments - 04/12/18 1422      MEDFICTS Scores   Pre Score  30    Post Score  6    Score Difference  -24       Education Questionnaire Score: Knowledge Questionnaire Score - 04/11/18 1208      Knowledge Questionnaire Score   Pre Score  22/24    Post Score  21/24       Goals reviewed with patient; copy given to patient.

## 2018-04-19 DIAGNOSIS — M25642 Stiffness of left hand, not elsewhere classified: Secondary | ICD-10-CM | POA: Diagnosis not present

## 2018-04-19 DIAGNOSIS — M25641 Stiffness of right hand, not elsewhere classified: Secondary | ICD-10-CM | POA: Diagnosis not present

## 2018-04-19 DIAGNOSIS — S63283A Dislocation of proximal interphalangeal joint of left middle finger, initial encounter: Secondary | ICD-10-CM | POA: Diagnosis not present

## 2018-04-19 DIAGNOSIS — M67844 Other specified disorders of tendon, left hand: Secondary | ICD-10-CM | POA: Diagnosis not present

## 2018-04-22 DIAGNOSIS — M25642 Stiffness of left hand, not elsewhere classified: Secondary | ICD-10-CM | POA: Diagnosis not present

## 2018-04-22 DIAGNOSIS — S63283D Dislocation of proximal interphalangeal joint of left middle finger, subsequent encounter: Secondary | ICD-10-CM | POA: Diagnosis not present

## 2018-04-23 DIAGNOSIS — M25642 Stiffness of left hand, not elsewhere classified: Secondary | ICD-10-CM | POA: Diagnosis not present

## 2018-04-23 DIAGNOSIS — S63283D Dislocation of proximal interphalangeal joint of left middle finger, subsequent encounter: Secondary | ICD-10-CM | POA: Diagnosis not present

## 2018-04-24 DIAGNOSIS — S63283D Dislocation of proximal interphalangeal joint of left middle finger, subsequent encounter: Secondary | ICD-10-CM | POA: Diagnosis not present

## 2018-04-24 DIAGNOSIS — M25642 Stiffness of left hand, not elsewhere classified: Secondary | ICD-10-CM | POA: Diagnosis not present

## 2018-04-29 DIAGNOSIS — M25642 Stiffness of left hand, not elsewhere classified: Secondary | ICD-10-CM | POA: Diagnosis not present

## 2018-04-29 DIAGNOSIS — S63283D Dislocation of proximal interphalangeal joint of left middle finger, subsequent encounter: Secondary | ICD-10-CM | POA: Diagnosis not present

## 2018-05-01 ENCOUNTER — Other Ambulatory Visit: Payer: Self-pay | Admitting: Physician Assistant

## 2018-05-01 DIAGNOSIS — M25642 Stiffness of left hand, not elsewhere classified: Secondary | ICD-10-CM | POA: Diagnosis not present

## 2018-05-01 DIAGNOSIS — S63283D Dislocation of proximal interphalangeal joint of left middle finger, subsequent encounter: Secondary | ICD-10-CM | POA: Diagnosis not present

## 2018-05-02 DIAGNOSIS — M25642 Stiffness of left hand, not elsewhere classified: Secondary | ICD-10-CM | POA: Diagnosis not present

## 2018-05-03 DIAGNOSIS — M25642 Stiffness of left hand, not elsewhere classified: Secondary | ICD-10-CM | POA: Diagnosis not present

## 2018-05-03 DIAGNOSIS — S63283D Dislocation of proximal interphalangeal joint of left middle finger, subsequent encounter: Secondary | ICD-10-CM | POA: Diagnosis not present

## 2018-05-08 ENCOUNTER — Encounter (HOSPITAL_COMMUNITY): Payer: PPO | Attending: Internal Medicine

## 2018-05-10 ENCOUNTER — Encounter (HOSPITAL_COMMUNITY): Payer: PPO

## 2018-05-10 DIAGNOSIS — S63293D Dislocation of distal interphalangeal joint of left middle finger, subsequent encounter: Secondary | ICD-10-CM | POA: Diagnosis not present

## 2018-05-10 DIAGNOSIS — S63283D Dislocation of proximal interphalangeal joint of left middle finger, subsequent encounter: Secondary | ICD-10-CM | POA: Diagnosis not present

## 2018-05-10 DIAGNOSIS — M25642 Stiffness of left hand, not elsewhere classified: Secondary | ICD-10-CM | POA: Diagnosis not present

## 2018-05-13 ENCOUNTER — Encounter (HOSPITAL_COMMUNITY): Payer: PPO

## 2018-05-13 DIAGNOSIS — M25642 Stiffness of left hand, not elsewhere classified: Secondary | ICD-10-CM | POA: Diagnosis not present

## 2018-05-13 DIAGNOSIS — S63283D Dislocation of proximal interphalangeal joint of left middle finger, subsequent encounter: Secondary | ICD-10-CM | POA: Diagnosis not present

## 2018-05-13 DIAGNOSIS — S63293D Dislocation of distal interphalangeal joint of left middle finger, subsequent encounter: Secondary | ICD-10-CM | POA: Diagnosis not present

## 2018-05-15 ENCOUNTER — Encounter (HOSPITAL_COMMUNITY): Payer: PPO

## 2018-05-15 DIAGNOSIS — M25642 Stiffness of left hand, not elsewhere classified: Secondary | ICD-10-CM | POA: Diagnosis not present

## 2018-05-15 DIAGNOSIS — S63283D Dislocation of proximal interphalangeal joint of left middle finger, subsequent encounter: Secondary | ICD-10-CM | POA: Diagnosis not present

## 2018-05-17 ENCOUNTER — Encounter (HOSPITAL_COMMUNITY): Payer: PPO

## 2018-05-17 DIAGNOSIS — M25642 Stiffness of left hand, not elsewhere classified: Secondary | ICD-10-CM | POA: Diagnosis not present

## 2018-05-17 DIAGNOSIS — S63283D Dislocation of proximal interphalangeal joint of left middle finger, subsequent encounter: Secondary | ICD-10-CM | POA: Diagnosis not present

## 2018-05-20 ENCOUNTER — Encounter (HOSPITAL_COMMUNITY): Payer: PPO

## 2018-05-24 ENCOUNTER — Encounter (HOSPITAL_COMMUNITY): Payer: PPO

## 2018-05-24 DIAGNOSIS — S63283D Dislocation of proximal interphalangeal joint of left middle finger, subsequent encounter: Secondary | ICD-10-CM | POA: Diagnosis not present

## 2018-05-24 DIAGNOSIS — M25642 Stiffness of left hand, not elsewhere classified: Secondary | ICD-10-CM | POA: Diagnosis not present

## 2018-05-27 ENCOUNTER — Encounter (HOSPITAL_COMMUNITY): Payer: PPO

## 2018-05-27 DIAGNOSIS — S63283D Dislocation of proximal interphalangeal joint of left middle finger, subsequent encounter: Secondary | ICD-10-CM | POA: Diagnosis not present

## 2018-05-27 DIAGNOSIS — M25642 Stiffness of left hand, not elsewhere classified: Secondary | ICD-10-CM | POA: Diagnosis not present

## 2018-05-30 DIAGNOSIS — S63283D Dislocation of proximal interphalangeal joint of left middle finger, subsequent encounter: Secondary | ICD-10-CM | POA: Diagnosis not present

## 2018-05-30 DIAGNOSIS — M25642 Stiffness of left hand, not elsewhere classified: Secondary | ICD-10-CM | POA: Diagnosis not present

## 2018-05-31 ENCOUNTER — Encounter (HOSPITAL_COMMUNITY): Payer: PPO | Attending: Internal Medicine

## 2018-06-03 ENCOUNTER — Encounter (HOSPITAL_COMMUNITY): Payer: PPO

## 2018-06-04 DIAGNOSIS — S63283D Dislocation of proximal interphalangeal joint of left middle finger, subsequent encounter: Secondary | ICD-10-CM | POA: Diagnosis not present

## 2018-06-04 DIAGNOSIS — M25642 Stiffness of left hand, not elsewhere classified: Secondary | ICD-10-CM | POA: Diagnosis not present

## 2018-06-05 ENCOUNTER — Encounter (HOSPITAL_COMMUNITY): Payer: PPO

## 2018-06-06 DIAGNOSIS — M25642 Stiffness of left hand, not elsewhere classified: Secondary | ICD-10-CM | POA: Diagnosis not present

## 2018-06-07 ENCOUNTER — Encounter (HOSPITAL_COMMUNITY): Payer: PPO

## 2018-06-10 ENCOUNTER — Encounter (HOSPITAL_COMMUNITY): Payer: PPO

## 2018-06-11 DIAGNOSIS — S63283D Dislocation of proximal interphalangeal joint of left middle finger, subsequent encounter: Secondary | ICD-10-CM | POA: Diagnosis not present

## 2018-06-11 DIAGNOSIS — M25642 Stiffness of left hand, not elsewhere classified: Secondary | ICD-10-CM | POA: Diagnosis not present

## 2018-06-12 ENCOUNTER — Encounter (HOSPITAL_COMMUNITY): Payer: PPO

## 2018-06-14 ENCOUNTER — Encounter (HOSPITAL_COMMUNITY): Payer: PPO

## 2018-06-14 DIAGNOSIS — M25642 Stiffness of left hand, not elsewhere classified: Secondary | ICD-10-CM | POA: Diagnosis not present

## 2018-06-14 DIAGNOSIS — S63283D Dislocation of proximal interphalangeal joint of left middle finger, subsequent encounter: Secondary | ICD-10-CM | POA: Diagnosis not present

## 2018-06-17 ENCOUNTER — Encounter (HOSPITAL_COMMUNITY): Payer: PPO

## 2018-06-19 ENCOUNTER — Encounter (HOSPITAL_COMMUNITY): Payer: PPO

## 2018-06-20 DIAGNOSIS — L570 Actinic keratosis: Secondary | ICD-10-CM | POA: Diagnosis not present

## 2018-06-20 DIAGNOSIS — L821 Other seborrheic keratosis: Secondary | ICD-10-CM | POA: Diagnosis not present

## 2018-06-20 DIAGNOSIS — Z85828 Personal history of other malignant neoplasm of skin: Secondary | ICD-10-CM | POA: Diagnosis not present

## 2018-06-20 DIAGNOSIS — L812 Freckles: Secondary | ICD-10-CM | POA: Diagnosis not present

## 2018-06-20 DIAGNOSIS — D1801 Hemangioma of skin and subcutaneous tissue: Secondary | ICD-10-CM | POA: Diagnosis not present

## 2018-06-21 ENCOUNTER — Other Ambulatory Visit: Payer: Self-pay | Admitting: Physician Assistant

## 2018-06-21 ENCOUNTER — Encounter (HOSPITAL_COMMUNITY): Payer: PPO

## 2018-06-24 ENCOUNTER — Encounter (HOSPITAL_COMMUNITY): Payer: PPO

## 2018-06-25 DIAGNOSIS — S63283D Dislocation of proximal interphalangeal joint of left middle finger, subsequent encounter: Secondary | ICD-10-CM | POA: Diagnosis not present

## 2018-06-25 DIAGNOSIS — M25642 Stiffness of left hand, not elsewhere classified: Secondary | ICD-10-CM | POA: Diagnosis not present

## 2018-06-26 ENCOUNTER — Encounter (HOSPITAL_COMMUNITY): Payer: PPO

## 2018-06-28 ENCOUNTER — Encounter (HOSPITAL_COMMUNITY): Payer: PPO

## 2018-06-28 DIAGNOSIS — M25642 Stiffness of left hand, not elsewhere classified: Secondary | ICD-10-CM | POA: Diagnosis not present

## 2018-06-28 DIAGNOSIS — S63283D Dislocation of proximal interphalangeal joint of left middle finger, subsequent encounter: Secondary | ICD-10-CM | POA: Diagnosis not present

## 2018-07-01 ENCOUNTER — Encounter (HOSPITAL_COMMUNITY): Payer: PPO

## 2018-07-03 ENCOUNTER — Encounter (HOSPITAL_COMMUNITY): Payer: PPO

## 2018-07-05 ENCOUNTER — Encounter (HOSPITAL_COMMUNITY): Payer: PPO

## 2018-07-08 ENCOUNTER — Encounter (HOSPITAL_COMMUNITY): Payer: PPO

## 2018-07-08 DIAGNOSIS — M25642 Stiffness of left hand, not elsewhere classified: Secondary | ICD-10-CM | POA: Diagnosis not present

## 2018-07-08 DIAGNOSIS — S63283D Dislocation of proximal interphalangeal joint of left middle finger, subsequent encounter: Secondary | ICD-10-CM | POA: Diagnosis not present

## 2018-07-10 ENCOUNTER — Encounter (HOSPITAL_COMMUNITY): Payer: PPO

## 2018-07-11 DIAGNOSIS — M25642 Stiffness of left hand, not elsewhere classified: Secondary | ICD-10-CM | POA: Diagnosis not present

## 2018-07-12 ENCOUNTER — Encounter (HOSPITAL_COMMUNITY): Payer: PPO

## 2018-07-15 ENCOUNTER — Encounter (HOSPITAL_COMMUNITY): Payer: PPO

## 2018-07-16 DIAGNOSIS — M25642 Stiffness of left hand, not elsewhere classified: Secondary | ICD-10-CM | POA: Diagnosis not present

## 2018-07-16 DIAGNOSIS — S63283D Dislocation of proximal interphalangeal joint of left middle finger, subsequent encounter: Secondary | ICD-10-CM | POA: Diagnosis not present

## 2018-07-17 ENCOUNTER — Encounter (HOSPITAL_COMMUNITY): Payer: PPO

## 2018-07-19 ENCOUNTER — Encounter (HOSPITAL_COMMUNITY): Payer: PPO

## 2018-07-19 DIAGNOSIS — S63283D Dislocation of proximal interphalangeal joint of left middle finger, subsequent encounter: Secondary | ICD-10-CM | POA: Diagnosis not present

## 2018-07-19 DIAGNOSIS — M25642 Stiffness of left hand, not elsewhere classified: Secondary | ICD-10-CM | POA: Diagnosis not present

## 2018-07-22 ENCOUNTER — Encounter (HOSPITAL_COMMUNITY): Payer: PPO

## 2018-07-22 DIAGNOSIS — S63283D Dislocation of proximal interphalangeal joint of left middle finger, subsequent encounter: Secondary | ICD-10-CM | POA: Diagnosis not present

## 2018-07-22 DIAGNOSIS — M25642 Stiffness of left hand, not elsewhere classified: Secondary | ICD-10-CM | POA: Diagnosis not present

## 2018-07-24 ENCOUNTER — Encounter (HOSPITAL_COMMUNITY): Payer: PPO

## 2018-07-24 DIAGNOSIS — S63283D Dislocation of proximal interphalangeal joint of left middle finger, subsequent encounter: Secondary | ICD-10-CM | POA: Diagnosis not present

## 2018-07-24 DIAGNOSIS — M25642 Stiffness of left hand, not elsewhere classified: Secondary | ICD-10-CM | POA: Diagnosis not present

## 2018-07-26 ENCOUNTER — Encounter (HOSPITAL_COMMUNITY): Payer: PPO

## 2018-07-29 ENCOUNTER — Encounter (HOSPITAL_COMMUNITY): Payer: PPO

## 2018-07-31 ENCOUNTER — Encounter (HOSPITAL_COMMUNITY): Payer: PPO

## 2018-08-02 ENCOUNTER — Encounter (HOSPITAL_COMMUNITY): Payer: PPO

## 2018-08-05 ENCOUNTER — Encounter (HOSPITAL_COMMUNITY): Payer: PPO

## 2018-08-07 ENCOUNTER — Encounter (HOSPITAL_COMMUNITY): Payer: PPO

## 2018-08-09 ENCOUNTER — Encounter (HOSPITAL_COMMUNITY): Payer: PPO

## 2018-08-09 DIAGNOSIS — M25642 Stiffness of left hand, not elsewhere classified: Secondary | ICD-10-CM | POA: Diagnosis not present

## 2018-08-09 DIAGNOSIS — S63283D Dislocation of proximal interphalangeal joint of left middle finger, subsequent encounter: Secondary | ICD-10-CM | POA: Diagnosis not present

## 2018-08-12 ENCOUNTER — Encounter (HOSPITAL_COMMUNITY): Payer: PPO

## 2018-08-14 ENCOUNTER — Encounter (HOSPITAL_COMMUNITY): Payer: PPO

## 2018-08-16 ENCOUNTER — Encounter (HOSPITAL_COMMUNITY): Payer: PPO

## 2018-08-19 ENCOUNTER — Encounter (HOSPITAL_COMMUNITY): Payer: PPO

## 2018-08-21 ENCOUNTER — Encounter (HOSPITAL_COMMUNITY): Payer: PPO

## 2018-08-22 DIAGNOSIS — M25562 Pain in left knee: Secondary | ICD-10-CM | POA: Diagnosis not present

## 2018-08-23 ENCOUNTER — Encounter (HOSPITAL_COMMUNITY): Payer: PPO

## 2018-08-26 ENCOUNTER — Encounter (HOSPITAL_COMMUNITY): Payer: PPO

## 2018-08-28 ENCOUNTER — Encounter (HOSPITAL_COMMUNITY): Payer: PPO

## 2018-08-30 ENCOUNTER — Encounter (HOSPITAL_COMMUNITY): Payer: PPO

## 2018-09-02 ENCOUNTER — Encounter (HOSPITAL_COMMUNITY): Payer: PPO

## 2018-09-04 ENCOUNTER — Encounter (HOSPITAL_COMMUNITY): Payer: PPO

## 2018-09-06 ENCOUNTER — Encounter (HOSPITAL_COMMUNITY): Payer: PPO

## 2018-09-09 ENCOUNTER — Encounter (HOSPITAL_COMMUNITY): Payer: PPO

## 2018-09-10 ENCOUNTER — Telehealth: Payer: Self-pay | Admitting: *Deleted

## 2018-09-10 NOTE — Telephone Encounter (Signed)
Covid-19 travel screening questions  Have you traveled in the last 14 days? If yes where? No  Do you now or have you had a fever in the last 14 days? No  Do you have any respiratory symptoms of shortness of breath or cough now or in the last 14 days? No  Do you have a medical history of Congestive Heart Failure? No  Do you have a medical history of lung disease? No  Do you have any family members or close contacts with diagnosed or suspected Covid-19? Yes 4/10

## 2018-09-11 ENCOUNTER — Inpatient Hospital Stay: Admission: RE | Admit: 2018-09-11 | Payer: PPO | Source: Ambulatory Visit

## 2018-09-11 ENCOUNTER — Encounter (HOSPITAL_COMMUNITY): Payer: PPO

## 2018-09-13 ENCOUNTER — Encounter (HOSPITAL_COMMUNITY): Payer: PPO

## 2018-09-16 ENCOUNTER — Encounter (HOSPITAL_COMMUNITY): Payer: PPO

## 2018-09-18 ENCOUNTER — Encounter (HOSPITAL_COMMUNITY): Payer: PPO

## 2018-09-20 ENCOUNTER — Encounter (HOSPITAL_COMMUNITY): Payer: PPO

## 2018-09-23 ENCOUNTER — Encounter (HOSPITAL_COMMUNITY): Payer: PPO

## 2018-09-23 ENCOUNTER — Telehealth: Payer: Self-pay | Admitting: *Deleted

## 2018-09-23 NOTE — Telephone Encounter (Signed)
Matthew Briggs, will call back to schedule his appointment @ 0859 am.

## 2018-09-25 ENCOUNTER — Encounter (HOSPITAL_COMMUNITY): Payer: PPO

## 2018-09-27 ENCOUNTER — Encounter (HOSPITAL_COMMUNITY): Payer: PPO

## 2018-09-30 ENCOUNTER — Encounter (HOSPITAL_COMMUNITY): Payer: PPO

## 2018-10-02 ENCOUNTER — Encounter (HOSPITAL_COMMUNITY): Payer: PPO

## 2018-10-04 ENCOUNTER — Encounter (HOSPITAL_COMMUNITY): Payer: PPO

## 2018-10-07 ENCOUNTER — Other Ambulatory Visit: Payer: Self-pay | Admitting: Cardiology

## 2018-10-07 ENCOUNTER — Encounter (HOSPITAL_COMMUNITY): Payer: PPO

## 2018-10-07 ENCOUNTER — Other Ambulatory Visit: Payer: Self-pay | Admitting: Physician Assistant

## 2018-10-09 ENCOUNTER — Other Ambulatory Visit: Payer: Self-pay

## 2018-10-09 ENCOUNTER — Encounter (HOSPITAL_COMMUNITY): Payer: PPO

## 2018-10-09 NOTE — Telephone Encounter (Signed)
Refill

## 2018-10-10 ENCOUNTER — Telehealth: Payer: Self-pay | Admitting: *Deleted

## 2018-10-10 NOTE — Telephone Encounter (Signed)

## 2018-10-11 ENCOUNTER — Ambulatory Visit (INDEPENDENT_AMBULATORY_CARE_PROVIDER_SITE_OTHER)
Admission: RE | Admit: 2018-10-11 | Discharge: 2018-10-11 | Disposition: A | Discharge status: Home / Self Care | Payer: PPO | Source: Ambulatory Visit | Attending: Cardiology | Admitting: Cardiology

## 2018-10-11 ENCOUNTER — Encounter (HOSPITAL_COMMUNITY): Payer: PPO

## 2018-10-11 ENCOUNTER — Other Ambulatory Visit: Payer: Self-pay

## 2018-10-11 ENCOUNTER — Other Ambulatory Visit: Payer: Self-pay | Admitting: Physician Assistant

## 2018-10-11 DIAGNOSIS — R918 Other nonspecific abnormal finding of lung field: Secondary | ICD-10-CM

## 2018-10-14 ENCOUNTER — Encounter (HOSPITAL_COMMUNITY): Payer: PPO

## 2018-10-16 ENCOUNTER — Encounter (HOSPITAL_COMMUNITY): Payer: PPO

## 2018-10-17 ENCOUNTER — Telehealth: Payer: Self-pay | Admitting: Cardiology

## 2018-10-17 NOTE — Telephone Encounter (Signed)
New message:    Patient calling concerning appt he states he needs a appt

## 2018-10-18 ENCOUNTER — Encounter (HOSPITAL_COMMUNITY): Payer: PPO

## 2018-10-23 ENCOUNTER — Encounter (HOSPITAL_COMMUNITY): Payer: PPO

## 2018-10-25 ENCOUNTER — Encounter (HOSPITAL_COMMUNITY): Payer: PPO

## 2018-10-29 ENCOUNTER — Telehealth: Payer: Self-pay | Admitting: *Deleted

## 2018-10-29 NOTE — Telephone Encounter (Signed)
Call and spoke with pt about his CT result, result given as Dr Percival Spanish has written, pt have questions about his result and concerns about the pulmonary nodule, pt will like for Dr Percival Spanish to call him.

## 2018-10-29 NOTE — Telephone Encounter (Signed)
-----   Message from Minus Breeding, MD sent at 10/15/2018  8:04 AM EDT ----- No acute findings. Decreased size of 5 mm right lower lobe pulmonary nodule, consistent with resolving inflammatory or infectious etiology. Stable 4 mm subpleural nodule in left lower lobe, consistent with benign etiology. No further work up or imaging at this time.  No evidence of malignancy Call Mr. Senske with the results and send results to Marton Redwood, MD

## 2018-11-01 NOTE — Telephone Encounter (Signed)
OK to stop Plavix.   Discussed results of the CT.  I will see him in August.

## 2018-11-19 DIAGNOSIS — Z20828 Contact with and (suspected) exposure to other viral communicable diseases: Secondary | ICD-10-CM | POA: Diagnosis not present

## 2018-11-27 ENCOUNTER — Other Ambulatory Visit: Payer: Self-pay | Admitting: Medical

## 2018-12-01 ENCOUNTER — Telehealth: Payer: PPO | Admitting: Family

## 2018-12-01 DIAGNOSIS — Z20822 Contact with and (suspected) exposure to covid-19: Secondary | ICD-10-CM

## 2018-12-01 MED ORDER — BENZONATATE 100 MG PO CAPS: 100.0000 mg | ORAL_CAPSULE | Freq: Two times a day (BID) | ORAL | 0 refills | Status: DC | PRN

## 2018-12-01 NOTE — Progress Notes (Signed)
E-Visit for Corona Virus Screening   Your current symptoms could be consistent with the coronavirus.  Call your health care provider or local health department to request and arrange formal testing. Many health care providers can now test patients at their office but not all are.  Please quarantine yourself while awaiting your test results.  [place] [phone], [place], [place] or visit [email or web address]     COVID-19 is a respiratory illness with symptoms that are similar to the flu. Symptoms are typically mild to moderate, but there have been cases of severe illness and death due to the virus. The following symptoms may appear 2-14 days after exposure: . Fever . Cough . Shortness of breath or difficulty breathing . Chills . Repeated shaking with chills . Muscle pain . Headache . Sore throat . New loss of taste or smell . Fatigue . Congestion or runny nose . Nausea or vomiting . Diarrhea  It is vitally important that if you feel that you have an infection such as this virus or any other virus that you stay home and away from places where you may spread it to others.  You should self-quarantine for 14 days if you have symptoms that could potentially be coronavirus or have been in close contact a with a person diagnosed with COVID-19 within the last 2 weeks. You should avoid contact with people age 71 and older.   You should wear a mask or cloth face covering over your nose and mouth if you must be around other people or animals, including pets (even at home). Try to stay at least 6 feet away from other people. This will protect the people around you.  You can use medication such as A prescription cough medication called Tessalon Perles 100 mg. You may take 1-2 capsules every 8 hours as needed for cough  You may also take  acetaminophen (Tylenol) as needed for fever.   Reduce your risk of any infection by using the same precautions used for avoiding the common cold or flu:  [place] Kitchen Wash your hands often with soap and warm water for at least 20 seconds.  If soap and water are not readily available, use an alcohol-based hand sanitizer with at least 60% alcohol.  . If coughing or sneezing, cover your mouth and nose by coughing or sneezing into the elbow areas of your shirt or coat, into a tissue or into your sleeve (not your hands). . Avoid shaking hands with others and consider head nods or verbal greetings only. . Avoid touching your eyes, nose, or mouth with unwashed hands.  . Avoid close contact with people who are sick. . Avoid places or events with large numbers of people in one location, like concerts or sporting events. . Carefully consider travel plans you have or are making. . If you are planning any travel outside or inside the [place], visit the CDC's Travelers' Health webpage for the latest health notices. . If you have some symptoms but not all symptoms, continue to monitor at home and seek medical attention if your symptoms worsen. . If you are having a medical emergency, call 911.  HOME CARE . Only take medications as instructed by your medical team. . Drink plenty of fluids and get plenty of rest. . A steam or ultrasonic humidifier can help if you have congestion.   GET HELP RIGHT AWAY IF YOU HAVE EMERGENCY WARNING SIGNS** FOR COVID-19. If you or someone is showing  any of these signs seek emergency medical care immediately. Call 911 or proceed to your closest emergency facility if: . You develop worsening high fever. . Trouble breathing . Bluish lips or face . Persistent pain or pressure in the chest . New confusion . Inability to wake or stay awake . You cough up blood. . Your symptoms become more severe  **This list is not all possible symptoms. Contact your medical provider for any symptoms that are  sever or concerning to you.   MAKE SURE YOU   Understand these instructions.  Will watch your condition.  Will get help right away if you are not doing well or get worse.  Your e-visit answers were reviewed by a board certified advanced clinical practitioner to complete your personal care plan.  Depending on the condition, your plan could have included both over the counter or prescription medications.  If there is a problem please reply once you have received a response from your provider.  Your safety is important to [place].  If you have drug allergies check your prescription carefully.    You can use MyChart to ask questions about today's visit, request a non-urgent call back, or ask for a work or school excuse for 24 hours related to this e-Visit. If it has been greater than 24 hours you will need to follow up with your provider, or enter a new e-Visit to address those concerns. You will get an e-mail in the next two days asking about your experience.  I hope that your e-visit has been valuable and will speed your recovery. Thank you for using e-visits.  Greater than 5 minutes, yet less than 10 minutes of time have been spent researching, coordinating, and implementing care for this patient today.  Thank you for the details you included in the comment boxes. Those details are very helpful in determining the best course of treatment for you and help [place] to provide the best care.

## 2018-12-02 DIAGNOSIS — Z20818 Contact with and (suspected) exposure to other bacterial communicable diseases: Secondary | ICD-10-CM | POA: Diagnosis not present

## 2018-12-02 DIAGNOSIS — Z20828 Contact with and (suspected) exposure to other viral communicable diseases: Secondary | ICD-10-CM | POA: Diagnosis not present

## 2018-12-02 DIAGNOSIS — I480 Paroxysmal atrial fibrillation: Secondary | ICD-10-CM | POA: Diagnosis not present

## 2018-12-02 DIAGNOSIS — R05 Cough: Secondary | ICD-10-CM | POA: Diagnosis not present

## 2018-12-18 ENCOUNTER — Telehealth: Payer: Self-pay | Admitting: Cardiology

## 2018-12-18 NOTE — Telephone Encounter (Signed)

## 2018-12-18 NOTE — Progress Notes (Signed)
Cardiology Office Note   Date:  12/19/2018   ID:  Matthew, Briggs 06/30/1945, MRN [id number]  PCP:  Marton Redwood, MD  Cardiologist:   Minus Breeding, MD    Chief Complaint  Patient presents with  . Coronary Artery Disease      History of Present Illness: Matthew Briggs is a 73 y.o. male who presents who presents for evaluation follow up of calcium score that was greater than 800 and 89% for his age.  He had a stress perfusion study in 2016 that demonstrated a fixed defect in the apical region.  The study was not gated secondary to PVCs. He had a POET (Plain Old Exercise Treadmill) in Jan 2018 that demonstrated no ischemia.  He had another this January and there were increased PVCs.  I sent him for a CTA.  This suggested severe stenosis and subtotal occlusion of the mid circumflex with probably mild disease in the LAD.   By [facility] there appeared to be hemodynamically significant first diagonal stenosis.  He had cath in May and was found to have severe 2 vessel disease and he had a drug eluding stent to the circ.   He subsequently was found to have atrial fib.  He did have an echo which demonstrated a normal EF.    Since I last saw him he has done well.  The patient denies any new symptoms such as chest discomfort, neck or arm discomfort. There has been no new shortness of breath, PND or orthopnea. There have been no reported palpitations, presyncope or syncope.  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  He is not exercising as much because he is working so much.  However, he denies any acute cardiovascular symptoms.   Past Medical History:  Diagnosis Date  . Abnormal computed tomography angiography (CTA) 10/05/2017  . Abnormal MRI   . Arthritis    "hands" (10/15/2017)  . Atrial fibrillation ([place])   . Coronary artery disease involving native heart 10/15/2017  . Essential hypertension 02/02/2015  . Hyperlipidemia with target LDL less than 70 02/02/2015  .  Hypogonadism, male   . Laceration of chest wall 08/08/2017  . PVC's (premature ventricular contractions) 01/27/2017    Past Surgical History:  Procedure Laterality Date  . CARPAL TUNNEL RELEASE Left 04/2017  . COLONOSCOPY    . CORONARY STENT INTERVENTION N/A 10/15/2017   Procedure: CORONARY STENT INTERVENTION;  Surgeon: Leonie Man, MD;  Location: [place] [facility];  Service: Cardiovascular;  Laterality: N/A;  . FRACTURE SURGERY    . INGUINAL HERNIA REPAIR Right ~ 1970  . IRRIGATION AND DEBRIDEMENT ABSCESS  08/08/2017   Procedure: IRRIGATION AND DEBRIDEMENT OF CHEST;  Surgeon: Judeth Horn, MD;  Location: [place];  Service: General;;  . LEFT HEART CATH AND CORONARY ANGIOGRAPHY N/A 10/15/2017   Procedure: LEFT HEART CATH AND CORONARY ANGIOGRAPHY;  Surgeon: Leonie Man, MD;  Location: [place] [facility];  Service: Cardiovascular;  Laterality: N/A;  . MINOR IRRIGATION AND DEBRIDEMENT OF WOUND Left 08/08/2017   Procedure: CLOSED REDUCTION REPAIR OF PIP JOINT/LACERATION LEFT MIDDLE FINGER;  Surgeon: Milly Jakob, MD;  Location: [place];  Service: Orthopedics;  Laterality: Left;  . OPEN REDUCTION INTERNAL FIXATION (ORIF) DISTAL PHALANX Left 08/13/2017   Procedure: OPEN TREATMENT OF LEFT LONG FINGER OPEN JOINT DISLOCATION OF PROXIMAL PHALANYX WITH EXTENSOR TENDON REPAIR;  Surgeon: Milly Jakob, MD;  Location: [place];  Service: Orthopedics;  Laterality: Left;  .  REPAIR EXTENSOR TENDON Left 08/13/2017   Procedure: REPAIR EXTENSOR TENDON;  Surgeon: Milly Jakob, MD;  Location: [place];  Service: Orthopedics;  Laterality: Left;     Current Outpatient Medications  Medication Sig Dispense Refill  . acetaminophen (TYLENOL) 325 MG tablet Take 2 tablets (650 mg total) by mouth every 6 (six) hours.    . benzonatate (TESSALON) 100 MG capsule Take 1 capsule (100 mg total) by mouth 2 (two) times daily as needed for cough. 20 capsule 0  .  Calcium-Phosphorus-Vitamin D (CITRACAL +D3 PO) Take 1 tablet by mouth daily with supper.    . cholecalciferol (VITAMIN D) 1000 UNITS tablet Take 1,000 Units by mouth daily with supper.     . Coenzyme Q10 (CO Q 10) 100 MG CAPS Take 100 mg by mouth daily with supper. Qunol CoQ10    . diclofenac sodium (VOLTAREN) 1 % GEL Apply 2-4 g topically 4 (four) times daily as needed (for arthritis pain).    Javier Docker Oil 500 MG CAPS Take 500 mg by mouth daily with supper.    . Multiple Vitamin (MULTIVITAMIN WITH MINERALS) TABS tablet Take 1 tablet by mouth daily with supper.     . Potassium 99 MG TABS Take 99 mg by mouth daily with supper.    . quiNINE (QUALAQUIN) 324 MG capsule Take 648 mg by mouth daily as needed (for cramps.).     Marland Kitchen rosuvastatin (CRESTOR) 20 MG tablet TAKE 1 TABLET(20 MG) BY MOUTH AT BEDTIME 90 tablet 1  . traMADol (ULTRAM) 50 MG tablet Take 1 tablet (50 mg total) by mouth every 6 (six) hours as needed (mild pain). 20 tablet 0  . valsartan-hydrochlorothiazide (DIOVAN-HCT) 160-12.5 MG tablet TAKE 1 TABLET BY MOUTH DAILY 90 tablet 0  . XARELTO 20 MG TABS tablet TAKE 1 TABLET(20 MG) BY MOUTH DAILY WITH SUPPER 30 tablet 5   No current facility-administered medications for this visit.     Allergies:   Patient has no known allergies.    ROS:  Please see the history of present illness.   Otherwise, review of systems are positive for none.   All other systems are reviewed and negative.    PHYSICAL EXAM: VS:  BP (!) 143/77   Pulse (!) 50   Ht 5\' 9"  (1.753 m)   Wt 185 lb 9.6 oz (84.2 kg)   SpO2 97%   BMI 27.41 kg/m  , BMI Body mass index is 27.41 kg/m. GENERAL:  Well appearing NECK:  No jugular venous distention, waveform within normal limits, carotid upstroke brisk and symmetric, no bruits, no thyromegaly LUNGS:  Clear to auscultation bilaterally BACK:  No CVA tenderness CHEST:  Unremarkable HEART:  PMI not displaced or sustained,S1 and S2 within normal limits, no S3, no S4, no  clicks, no rubs, no murmurs ABD:  Flat, positive bowel sounds normal in frequency in pitch, no bruits, no rebound, no guarding, no midline pulsatile mass, no hepatomegaly, no splenomegaly EXT:  2 plus pulses throughout, no edema, no cyanosis no clubbing   EKG:  EKG is ordered today. The ekg ordered today demonstrates sinus rhythm, rate 50, axis within normal limits, intervals within normal limits, no acute ST-T wave changes   Recent Labs: No results found for requested labs within last 8760 hours.    Lipid Panel    Component Value Date/Time   CHOL 140 11/28/2017 1201   TRIG 124 11/28/2017 1201   HDL 64 11/28/2017 1201   CHOLHDL 2.2 11/28/2017 1201  LDLCALC 51 11/28/2017 1201      Wt Readings from Last 3 Encounters:  12/19/18 185 lb 9.6 oz (84.2 kg)  04/11/18 186 lb 12.8 oz (84.7 kg)  04/01/18 183 lb 13.8 oz (83.4 kg)      Other studies Reviewed: Additional studies/ records that were reviewed today include: Labs. Review of the above records demonstrates:  Please see elsewhere in the note.     ASSESSMENT AND PLAN:   CAD:   The patient has no new sypmtoms.  No further cardiovascular testing is indicated.  We will continue with aggressive risk reduction and meds as listed.  ATRIAL FIB:  Mr. Ordell Prichett has a CHA2DS2 - VASc score of 3.  He will remains on Xarelto.   He has had asymptomatic paroxysms in the past.  He will remain on the meds as listed.\  CAD:  The patient has no new sypmtoms.  No further cardiovascular testing is indicated.  We will continue with aggressive risk reduction and meds as listed.  PULMONARY NODULE:   This was decreased in size on CT in May.  This was thought to be a resolving inflammatory process.  I reviewed the CT for this appointment.  DYSLIPIDEMIA: LDL was 59.  No change in therapy.  PVCs:    He does not notice these.  No change in therapy.  HTN:    The blood pressure is well controlled.  He will continue the meds as listed.     Current medicines are reviewed at length with the patient today.  The patient does not have concerns regarding medicines.  The following changes have been made:  no change  Labs/ tests ordered today include: None No orders of the defined types were placed in this encounter.    Disposition:   FU with me in 12 months.     Signed, Minus Breeding, MD  12/19/2018 10:31 AM    [facility]

## 2018-12-19 ENCOUNTER — Ambulatory Visit (INDEPENDENT_AMBULATORY_CARE_PROVIDER_SITE_OTHER): Payer: PPO | Admitting: Cardiology

## 2018-12-19 ENCOUNTER — Other Ambulatory Visit: Payer: Self-pay

## 2018-12-19 ENCOUNTER — Encounter: Payer: Self-pay | Admitting: Cardiology

## 2018-12-19 VITALS — BP 143/77 | HR 50 | Ht 69.0 in | Wt 185.6 lb

## 2018-12-19 DIAGNOSIS — I10 Essential (primary) hypertension: Secondary | ICD-10-CM

## 2018-12-19 DIAGNOSIS — I2510 Atherosclerotic heart disease of native coronary artery without angina pectoris: Secondary | ICD-10-CM | POA: Diagnosis not present

## 2018-12-19 DIAGNOSIS — Z9861 Coronary angioplasty status: Secondary | ICD-10-CM

## 2018-12-19 DIAGNOSIS — I4891 Unspecified atrial fibrillation: Secondary | ICD-10-CM | POA: Diagnosis not present

## 2018-12-19 DIAGNOSIS — E785 Hyperlipidemia, unspecified: Secondary | ICD-10-CM | POA: Diagnosis not present

## 2018-12-19 NOTE — Patient Instructions (Signed)
Follow-Up: You will need a follow up appointment in 12 months.  Please call our office 2 months in advance, 09-2019 to schedule this, 11-2019 appointment.  You may see Minus Breeding, MD or one of the following Advanced Practice Providers on your designated Care Team:  [name], PA-C  Jory Sims, DNP, ANP       Medication Instructions:  The current medical regimen is effective;  continue present plan and medications as directed. Please refer to the Current Medication list given to you today. If you need a refill on your cardiac medications before your next appointment, please call your pharmacy. Labwork: When you have labs (blood work) and your tests are completely normal, you will receive your results ONLY by [place] (if you have MyChart) -OR- A paper copy in the mail.  At [facility], you and your health needs are our priority.  As part of our continuing mission to provide you with exceptional heart care, we have created designated Provider Care Teams.  These Care Teams include your primary Cardiologist (physician) and Advanced Practice Providers (APPs -  Physician Assistants and Nurse Practitioners) who all work together to provide you with the care you need, when you need it.  Thank you for choosing [facility] at [facility]!!

## 2019-01-08 ENCOUNTER — Other Ambulatory Visit: Payer: Self-pay | Admitting: Physician Assistant

## 2019-01-08 NOTE — Telephone Encounter (Signed)
Will send to NL as pt see's Dr. Percival Spanish.

## 2019-01-16 DIAGNOSIS — L821 Other seborrheic keratosis: Secondary | ICD-10-CM | POA: Diagnosis not present

## 2019-01-16 DIAGNOSIS — Z85828 Personal history of other malignant neoplasm of skin: Secondary | ICD-10-CM | POA: Diagnosis not present

## 2019-01-16 DIAGNOSIS — L812 Freckles: Secondary | ICD-10-CM | POA: Diagnosis not present

## 2019-01-16 DIAGNOSIS — D225 Melanocytic nevi of trunk: Secondary | ICD-10-CM | POA: Diagnosis not present

## 2019-01-16 DIAGNOSIS — D1801 Hemangioma of skin and subcutaneous tissue: Secondary | ICD-10-CM | POA: Diagnosis not present

## 2019-01-16 DIAGNOSIS — D2361 Other benign neoplasm of skin of right upper limb, including shoulder: Secondary | ICD-10-CM | POA: Diagnosis not present

## 2019-01-16 DIAGNOSIS — L570 Actinic keratosis: Secondary | ICD-10-CM | POA: Diagnosis not present

## 2019-03-01 DIAGNOSIS — I10 Essential (primary) hypertension: Secondary | ICD-10-CM | POA: Diagnosis not present

## 2019-03-11 DIAGNOSIS — Z20828 Contact with and (suspected) exposure to other viral communicable diseases: Secondary | ICD-10-CM | POA: Diagnosis not present

## 2019-03-11 DIAGNOSIS — I480 Paroxysmal atrial fibrillation: Secondary | ICD-10-CM | POA: Diagnosis not present

## 2019-03-11 DIAGNOSIS — J309 Allergic rhinitis, unspecified: Secondary | ICD-10-CM | POA: Diagnosis not present

## 2019-03-11 DIAGNOSIS — R05 Cough: Secondary | ICD-10-CM | POA: Diagnosis not present

## 2019-03-25 DIAGNOSIS — Z125 Encounter for screening for malignant neoplasm of prostate: Secondary | ICD-10-CM | POA: Diagnosis not present

## 2019-03-25 DIAGNOSIS — R7301 Impaired fasting glucose: Secondary | ICD-10-CM | POA: Diagnosis not present

## 2019-03-25 DIAGNOSIS — E291 Testicular hypofunction: Secondary | ICD-10-CM | POA: Diagnosis not present

## 2019-03-25 DIAGNOSIS — E7849 Other hyperlipidemia: Secondary | ICD-10-CM | POA: Diagnosis not present

## 2019-03-25 DIAGNOSIS — Z1212 Encounter for screening for malignant neoplasm of rectum: Secondary | ICD-10-CM | POA: Diagnosis not present
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
## 2019-03-25 IMAGING — DX DG HAND 2V*L*
1 series · 2 of 2 positions shown · non-contrast
Comparison: Same day

CLINICAL DATA: Post reduction

EXAM:
LEFT HAND - 2 VIEW

[Series 1: hand · 0.14mm/px · 2 of 2 slices shown]
[im 1/2]
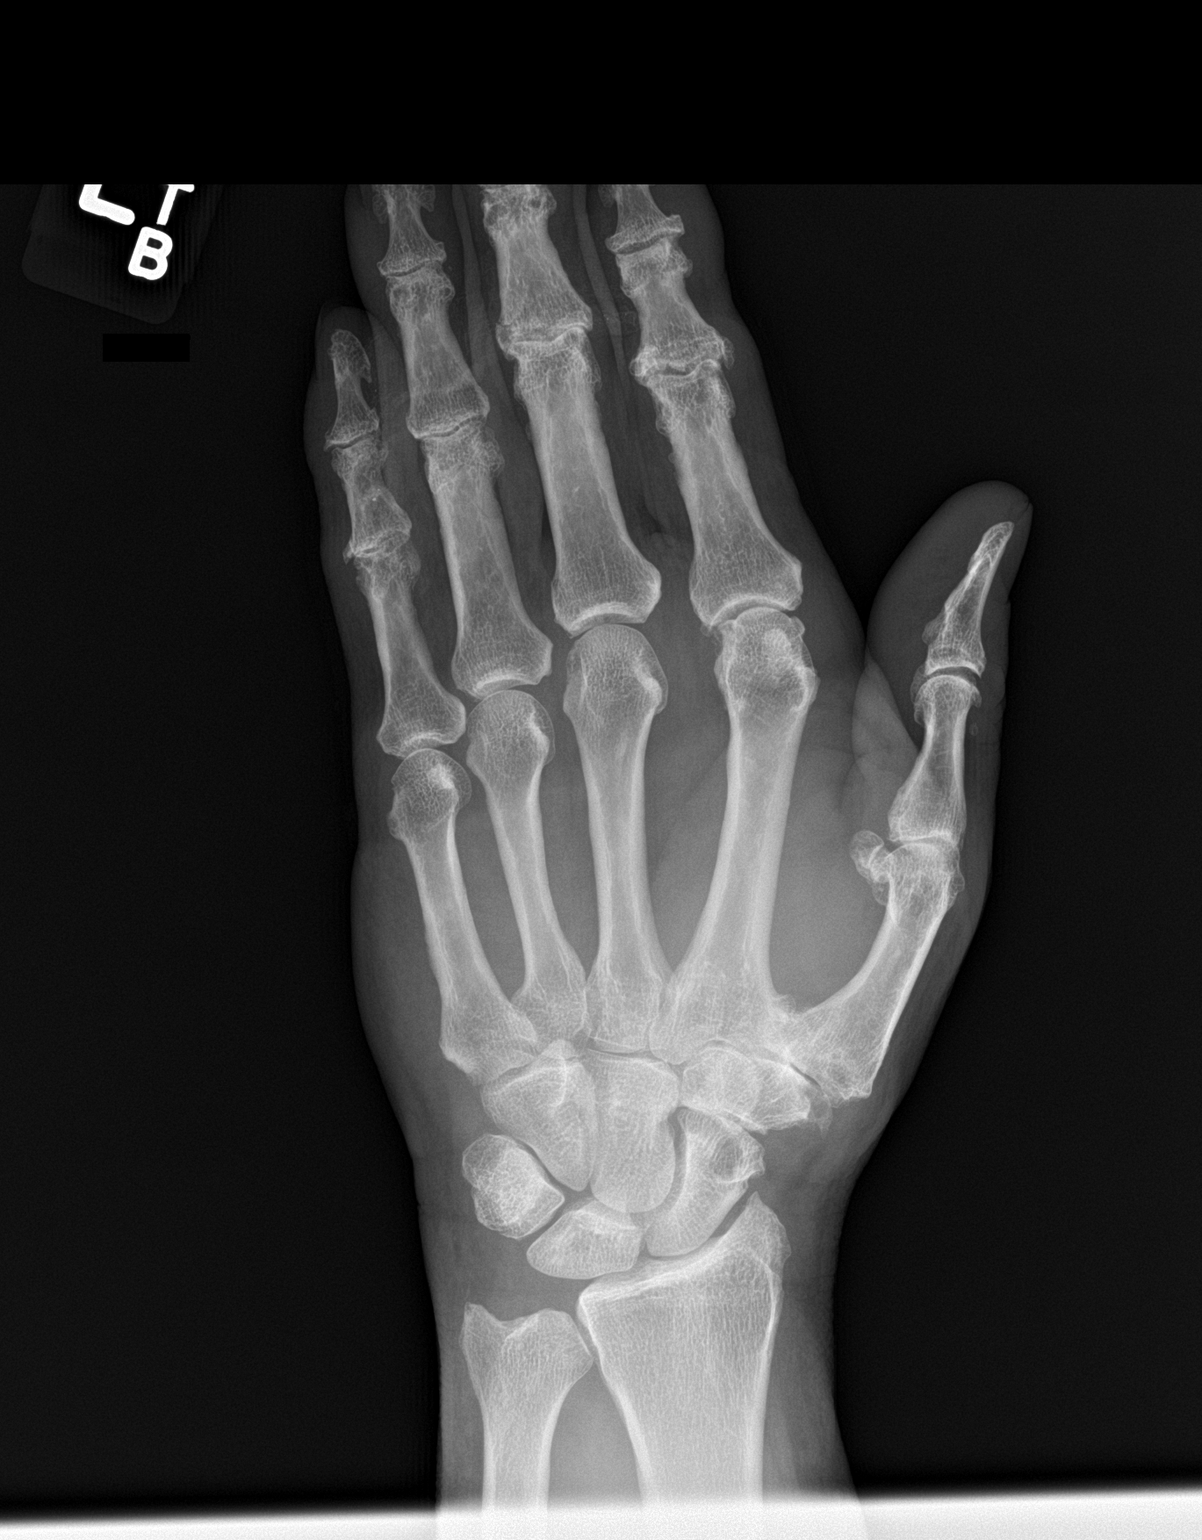
[im 2/2]
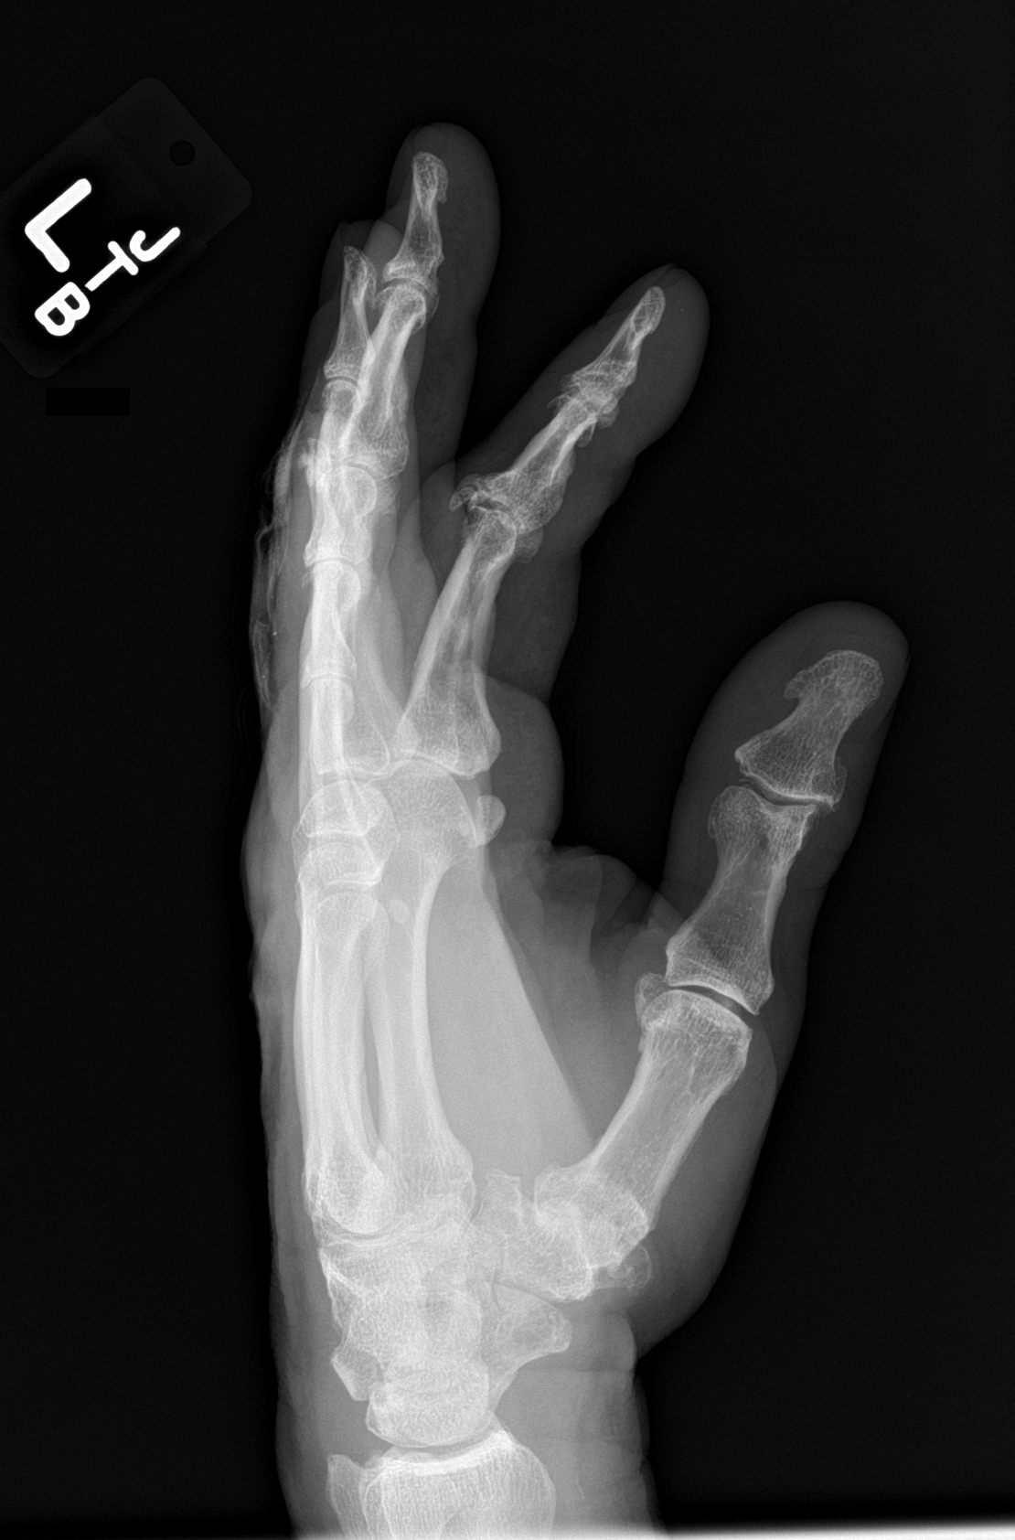

[2 of 2 positions shown; findings below may reference images not displayed]

FINDINGS: Two views show reduction of the fracture subluxation at the PIP
joint of the long finger. No displaced fragments seen. Arthritic
changes of the inter phalangeal joints diffusely as seen previously.
IMPRESSION: Good reduction of the fracture subluxation of the PIP joint of the
long finger.

## 2019-04-01 DIAGNOSIS — E785 Hyperlipidemia, unspecified: Secondary | ICD-10-CM | POA: Diagnosis not present

## 2019-04-01 DIAGNOSIS — R972 Elevated prostate specific antigen [PSA]: Secondary | ICD-10-CM | POA: Diagnosis not present

## 2019-04-01 DIAGNOSIS — Z1331 Encounter for screening for depression: Secondary | ICD-10-CM | POA: Diagnosis not present

## 2019-04-01 DIAGNOSIS — I480 Paroxysmal atrial fibrillation: Secondary | ICD-10-CM | POA: Diagnosis not present

## 2019-04-01 DIAGNOSIS — D696 Thrombocytopenia, unspecified: Secondary | ICD-10-CM | POA: Diagnosis not present

## 2019-04-01 DIAGNOSIS — R82998 Other abnormal findings in urine: Secondary | ICD-10-CM | POA: Diagnosis not present

## 2019-04-01 DIAGNOSIS — R7301 Impaired fasting glucose: Secondary | ICD-10-CM | POA: Diagnosis not present

## 2019-04-01 DIAGNOSIS — I10 Essential (primary) hypertension: Secondary | ICD-10-CM | POA: Diagnosis not present

## 2019-04-01 DIAGNOSIS — Z23 Encounter for immunization: Secondary | ICD-10-CM | POA: Diagnosis not present

## 2019-04-01 DIAGNOSIS — Z86010 Personal history of colonic polyps: Secondary | ICD-10-CM | POA: Diagnosis not present

## 2019-04-01 DIAGNOSIS — I2510 Atherosclerotic heart disease of native coronary artery without angina pectoris: Secondary | ICD-10-CM | POA: Diagnosis not present

## 2019-04-01 DIAGNOSIS — D6869 Other thrombophilia: Secondary | ICD-10-CM | POA: Diagnosis not present

## 2019-04-01 DIAGNOSIS — Z0000 Encounter for general adult medical examination without abnormal findings: Secondary | ICD-10-CM | POA: Diagnosis not present

## 2019-04-07 ENCOUNTER — Other Ambulatory Visit: Payer: Self-pay

## 2019-04-07 MED ORDER — RIVAROXABAN 20 MG PO TABS: ORAL_TABLET | ORAL | 5 refills | Status: DC

## 2019-04-07 NOTE — Telephone Encounter (Signed)
Refill

## 2019-05-21 ENCOUNTER — Other Ambulatory Visit: Payer: Self-pay

## 2019-05-21 MED ORDER — ROSUVASTATIN CALCIUM 20 MG PO TABS: ORAL_TABLET | ORAL | 3 refills | Status: DC

## 2019-06-13 ENCOUNTER — Ambulatory Visit: Payer: PPO | Attending: Internal Medicine

## 2019-06-13 DIAGNOSIS — Z20822 Contact with and (suspected) exposure to covid-19: Secondary | ICD-10-CM

## 2019-06-14 LAB — NOVEL CORONAVIRUS, NAA: SARS-CoV-2, NAA: NOT DETECTED

## 2019-06-17 ENCOUNTER — Ambulatory Visit: Payer: PPO | Attending: Internal Medicine

## 2019-06-17 DIAGNOSIS — Z23 Encounter for immunization: Secondary | ICD-10-CM

## 2019-06-17 NOTE — Progress Notes (Signed)
   Covid-19 Vaccination Clinic  Name:  Matthew Briggs    MRN: [id number] DOB: 1945/07/30  06/17/2019  Matthew Briggs was observed post Covid-19 immunization for 15 minutes without incidence. He was provided with Vaccine Information Sheet and instruction to access the V-Safe system.   Matthew Briggs was instructed to call 911 with any severe reactions post vaccine: Marland Kitchen Difficulty breathing  . Swelling of your face and throat  . A fast heartbeat  . A bad rash all over your body  . Dizziness and weakness    Immunizations Administered    Name Date Dose VIS Date Route   Pfizer COVID-19 Vaccine 06/17/2019  3:52 PM 0.3 mL 05/09/2019 Intramuscular   Manufacturer: [employer]   Lot: [id number]   [place]: [id number]

## 2019-07-05 ENCOUNTER — Ambulatory Visit: Payer: PPO | Attending: Internal Medicine

## 2019-07-05 DIAGNOSIS — Z23 Encounter for immunization: Secondary | ICD-10-CM

## 2019-07-05 NOTE — Progress Notes (Signed)
   Covid-19 Vaccination Clinic  Name:  Matthew Briggs    MRN: [id number] DOB: Jun 30, 1945  07/05/2019  [name] was observed post Covid-19 immunization for 15 minutes without incidence. He was provided with Vaccine Information Sheet and instruction to access the V-Safe system.   [name] was instructed to call 911 with any severe reactions post vaccine: Marland Kitchen Difficulty breathing  . Swelling of your face and throat  . A fast heartbeat  . A bad rash all over your body  . Dizziness and weakness    Immunizations Administered    Name Date Dose VIS Date Route   Pfizer COVID-19 Vaccine 07/05/2019 10:16 AM 0.3 mL 05/09/2019 Intramuscular   Manufacturer: [place]   Lot: [id number]   [place]: [id number]

## 2019-07-11 DIAGNOSIS — R972 Elevated prostate specific antigen [PSA]: Secondary | ICD-10-CM | POA: Diagnosis not present

## 2019-07-22 DIAGNOSIS — L812 Freckles: Secondary | ICD-10-CM | POA: Diagnosis not present

## 2019-07-22 DIAGNOSIS — L821 Other seborrheic keratosis: Secondary | ICD-10-CM | POA: Diagnosis not present

## 2019-07-22 DIAGNOSIS — L570 Actinic keratosis: Secondary | ICD-10-CM | POA: Diagnosis not present

## 2019-07-22 DIAGNOSIS — Z85828 Personal history of other malignant neoplasm of skin: Secondary | ICD-10-CM | POA: Diagnosis not present

## 2019-07-22 DIAGNOSIS — D1801 Hemangioma of skin and subcutaneous tissue: Secondary | ICD-10-CM | POA: Diagnosis not present

## 2019-08-14 DIAGNOSIS — J069 Acute upper respiratory infection, unspecified: Secondary | ICD-10-CM | POA: Diagnosis not present

## 2019-08-14 DIAGNOSIS — R05 Cough: Secondary | ICD-10-CM | POA: Diagnosis not present

## 2019-08-14 DIAGNOSIS — Z20818 Contact with and (suspected) exposure to other bacterial communicable diseases: Secondary | ICD-10-CM | POA: Diagnosis not present

## 2019-08-17 ENCOUNTER — Telehealth: Payer: Self-pay | Admitting: Physician Assistant

## 2019-08-17 NOTE — Telephone Encounter (Signed)
Ms Giacona called because her husband has gained 8 lbs in the last few days.   He was diagnosed w/ PNA earlier this week, has been on a Zpack. He has not taken steroids.   His appetite has been poor, he has not been eating much. He has been spending a lot of time in bed.    He has been coughing up phlegm, yellow-green.  No fevers, he feels that he is generally improving from the pneumonia.  Because of the weight gain, they were concerned about the possibility of him having put on fluid.  I explained that I ask certain questions of patients I am evaluating for heart failure and asked them of him.  1.  Have you been waking up in the night with shortness of breath: No 2.  Have you had to prop up on extra pillows in order to breathe at night: No 3.  Have you been waking up with lower extremity edema: No 4.  Have you noticed more shortness of breath when you exert yourself or had trouble doing tasks because of shortness of breath: No  I explained to Mr. and Mrs. Koser that I had no reason to think that he had heart failure.  I cannot explain the 8 pound weight gain, but if he has gotten extra salt in the foods he has been eating because he is sick, it should equilibrate itself as long as he continues to take the losartan HCTZ.  As it has been almost a year since he has seen Dr. Percival Spanish, I will route this message to Dr. Percival Spanish and the schedulers to get him an appointment.  They are in agreement with this as a plan of care and will contact office if his symptoms change.  [name], PA-C 08/17/2019 1:35 PM

## 2019-08-18 NOTE — Telephone Encounter (Signed)
Left message for patient to call and schedule follow up appointment with Dr. Percival Spanish per [name], PA

## 2019-08-27 ENCOUNTER — Telehealth: Payer: Self-pay | Admitting: Cardiology

## 2019-08-27 NOTE — Telephone Encounter (Signed)
Called patient to schedule follow up appointment with Dr. Percival Spanish.  Patient states he is on a conference call and will call back to schedule

## 2019-09-18 DIAGNOSIS — J069 Acute upper respiratory infection, unspecified: Secondary | ICD-10-CM | POA: Diagnosis not present

## 2019-09-18 DIAGNOSIS — R05 Cough: Secondary | ICD-10-CM | POA: Diagnosis not present

## 2019-10-08 ENCOUNTER — Other Ambulatory Visit: Payer: Self-pay | Admitting: Cardiology

## 2019-12-28 DIAGNOSIS — Z7189 Other specified counseling: Secondary | ICD-10-CM | POA: Insufficient documentation

## 2019-12-28 NOTE — Progress Notes (Signed)
Cardiology Office Note   Date:  12/29/2019   ID:  Matthew Briggs, Matthew Briggs 07-13-1945, MRN [id number]  PCP:  Marton Redwood, MD  Cardiologist:   Minus Breeding, MD    Chief Complaint  Patient presents with  . Coronary Artery Disease      History of Present Illness: Matthew Briggs is a 74 y.o. male who presents who presents for evaluation follow up of calcium score that was greater than 800 and 89% for his age.  He had a stress perfusion study in 2016 that demonstrated a fixed defect in the apical region.  The study was not gated secondary to PVCs. He had a POET (Plain Old Exercise Treadmill) in Jan 2018 that demonstrated no ischemia.  He had another this January and there were increased PVCs.  I sent him for a CTA.  This suggested severe stenosis and subtotal occlusion of the mid circumflex with probably mild disease in the LAD.   By [facility] there appeared to be hemodynamically significant first diagonal stenosis.  He had cath in May and was found to have severe 2 vessel disease and he had a drug eluding stent to the circ.   He subsequently was found to have atrial fib.  He did have an echo which demonstrated a normal EF.    Since I last saw him he has done well from a cardiovascular standpoint. The patient denies any new symptoms such as chest discomfort, neck or arm discomfort. There has been no new shortness of breath, PND or orthopnea. There have been no reported palpitations, presyncope or syncope.  He still works quite a bit but he is only working 12-hour days now.     Past Medical History:  Diagnosis Date  . Abnormal computed tomography angiography (CTA) 10/05/2017  . Abnormal MRI   . Arthritis    "hands" (10/15/2017)  . Atrial fibrillation ([place])   . Coronary artery disease involving native heart 10/15/2017  . Essential hypertension 02/02/2015  . Hyperlipidemia with target LDL less than 70 02/02/2015  . Hypogonadism, male   . Laceration of chest wall 08/08/2017  . PVC's  (premature ventricular contractions) 01/27/2017    Past Surgical History:  Procedure Laterality Date  . CARPAL TUNNEL RELEASE Left 04/2017  . COLONOSCOPY    . CORONARY STENT INTERVENTION N/A 10/15/2017   Procedure: CORONARY STENT INTERVENTION;  Surgeon: Leonie Man, MD;  Location: [place] [facility];  Service: Cardiovascular;  Laterality: N/A;  . FRACTURE SURGERY    . INGUINAL HERNIA REPAIR Right ~ 1970  . IRRIGATION AND DEBRIDEMENT ABSCESS  08/08/2017   Procedure: IRRIGATION AND DEBRIDEMENT OF CHEST;  Surgeon: Judeth Horn, MD;  Location: [place];  Service: General;;  . LEFT HEART CATH AND CORONARY ANGIOGRAPHY N/A 10/15/2017   Procedure: LEFT HEART CATH AND CORONARY ANGIOGRAPHY;  Surgeon: Leonie Man, MD;  Location: [place] [facility];  Service: Cardiovascular;  Laterality: N/A;  . MINOR IRRIGATION AND DEBRIDEMENT OF WOUND Left 08/08/2017   Procedure: CLOSED REDUCTION REPAIR OF PIP JOINT/LACERATION LEFT MIDDLE FINGER;  Surgeon: Milly Jakob, MD;  Location: [place];  Service: Orthopedics;  Laterality: Left;  . OPEN REDUCTION INTERNAL FIXATION (ORIF) DISTAL PHALANX Left 08/13/2017   Procedure: OPEN TREATMENT OF LEFT LONG FINGER OPEN JOINT DISLOCATION OF PROXIMAL PHALANYX WITH EXTENSOR TENDON REPAIR;  Surgeon: Milly Jakob, MD;  Location: [place];  Service: Orthopedics;  Laterality: Left;  . REPAIR EXTENSOR TENDON Left 08/13/2017   Procedure: REPAIR EXTENSOR TENDON;  Surgeon: Milly Jakob, MD;  Location: [place];  Service: Orthopedics;  Laterality: Left;     Current Outpatient Medications  Medication Sig Dispense Refill  . acetaminophen (TYLENOL) 325 MG tablet Take 2 tablets (650 mg total) by mouth every 6 (six) hours.    . Calcium-Phosphorus-Vitamin D (CITRACAL +D3 PO) Take 1 tablet by mouth daily with supper.    . cholecalciferol (VITAMIN D) 1000 UNITS tablet Take 1,000 Units by mouth daily with supper.     . Coenzyme Q10 (CO Q 10) 100 MG CAPS  Take 100 mg by mouth daily with supper. Qunol CoQ10    . diclofenac sodium (VOLTAREN) 1 % GEL Apply 2-4 g topically 4 (four) times daily as needed (for arthritis pain).    Matthew Briggs 500 MG CAPS Take 500 mg by mouth daily with supper.    . Multiple Vitamin (MULTIVITAMIN WITH MINERALS) TABS tablet Take 1 tablet by mouth daily with supper.     . Potassium 99 MG TABS Take 99 mg by mouth daily with supper.    . quiNINE (QUALAQUIN) 324 MG capsule Take 648 mg by mouth daily as needed (for cramps.).     Matthew Briggs Kitchen rosuvastatin (CRESTOR) 20 MG tablet TAKE 1 TABLET(20 MG) BY MOUTH AT BEDTIME 90 tablet 3  . traMADol (ULTRAM) 50 MG tablet Take 1 tablet (50 mg total) by mouth every 6 (six) hours as needed (mild pain). 20 tablet 0  . valsartan-hydrochlorothiazide (DIOVAN-HCT) 160-12.5 MG tablet TAKE 1 TABLET BY MOUTH DAILY 90 tablet 3  . XARELTO 20 MG TABS tablet TAKE 1 TABLET(20 MG) BY MOUTH DAILY WITH SUPPER 30 tablet 5   No current facility-administered medications for this visit.    Allergies:   Patient has no known allergies.    ROS:  Please see the history of present illness.   Otherwise, review of systems are positive for none.   All other systems are reviewed and negative.    PHYSICAL EXAM: VS:  BP 120/70 (BP Location: Left Arm, Patient Position: Sitting, Cuff Size: Normal)   Pulse 56   Ht 5\' 9"  (1.753 m)   Wt 183 lb (83 kg)   BMI 27.02 kg/m  , BMI Body mass index is 27.02 kg/m. GENERAL:  Well appearing NECK:  No jugular venous distention, waveform within normal limits, carotid upstroke brisk and symmetric, no bruits, no thyromegaly LUNGS:  Clear to auscultation bilaterally CHEST:  Unremarkable HEART:  PMI not displaced or sustained,S1 and S2 within normal limits, no S3, no S4, no clicks, no rubs, no murmurs ABD:  Flat, positive bowel sounds normal in frequency in pitch, no bruits, no rebound, no guarding, no midline pulsatile mass, no hepatomegaly, no splenomegaly EXT:  2 plus pulses  throughout, no edema, no cyanosis no clubbing   EKG:  EKG is not ordered today. The ekg ordered today demonstrates sinus rhythm, rate 56, axis within normal limits, intervals within normal limits, no acute ST-T wave changes.  Nonspecific inferior Q waves.   Recent Labs: No results found for requested labs within last 8760 hours.    Lipid Panel    Component Value Date/Time   CHOL 140 11/28/2017 1201   TRIG 124 11/28/2017 1201   HDL 64 11/28/2017 1201   CHOLHDL 2.2 11/28/2017 1201   LDLCALC 51 11/28/2017 1201      Wt Readings from Last 3 Encounters:  12/29/19 183 lb (83 kg)  12/19/18 185 lb 9.6 oz (84.2 kg)  04/11/18 186 lb 12.8 oz (84.7  kg)      Other studies Reviewed: Additional studies/ records that were reviewed today include: Labs. Review of the above records demonstrates:  Please see elsewhere in the note.     ASSESSMENT AND PLAN:   CAD:   The patient has no new sypmtoms.  No further cardiovascular testing is indicated.  We will continue with aggressive risk reduction and meds as listed.  ATRIAL FIB:  Mr. Asuncion Shibata has a CHA2DS2 - VASc score of 3.    He does not feel any paroxysms of this.  He tolerates his anticoagulation.  I will check a CBC today.  PULMONARY NODULE:   This was decreased in size on CT in May.  This was thought to be a resolving inflammatory process.    DYSLIPIDEMIA: LDL was 55 last year.  I will check a follow-up lipid profile.  PVCs:    He has not noticed these.  No change in therapy.  HTN:    The blood pressure is controlled.  No change in therapy.   COVID EDUCATION: He has been vaccinated.   Current medicines are reviewed at length with the patient today.  The patient does not have concerns regarding medicines.  The following changes have been made:  None  Labs/ tests ordered today include:   Orders Placed This Encounter  Procedures  . CBC  . Lipid panel  . EKG 12-Lead     Disposition:   FU with me in 12 months.      Signed, Minus Breeding, MD  12/29/2019 11:16 AM    [facility]

## 2019-12-29 ENCOUNTER — Other Ambulatory Visit: Payer: Self-pay

## 2019-12-29 ENCOUNTER — Encounter: Payer: Self-pay | Admitting: Cardiology

## 2019-12-29 ENCOUNTER — Ambulatory Visit (INDEPENDENT_AMBULATORY_CARE_PROVIDER_SITE_OTHER): Payer: PPO | Admitting: Cardiology

## 2019-12-29 VITALS — BP 120/70 | HR 56 | Ht 69.0 in | Wt 183.0 lb

## 2019-12-29 DIAGNOSIS — I2510 Atherosclerotic heart disease of native coronary artery without angina pectoris: Secondary | ICD-10-CM

## 2019-12-29 DIAGNOSIS — I480 Paroxysmal atrial fibrillation: Secondary | ICD-10-CM | POA: Diagnosis not present

## 2019-12-29 DIAGNOSIS — Z7189 Other specified counseling: Secondary | ICD-10-CM

## 2019-12-29 DIAGNOSIS — E785 Hyperlipidemia, unspecified: Secondary | ICD-10-CM

## 2019-12-29 LAB — CBC
Hematocrit: 43.1 % (ref 37.5–51.0)
Hemoglobin: 14.6 g/dL (ref 13.0–17.7)
MCH: 30.9 pg (ref 26.6–33.0)
MCHC: 33.9 g/dL (ref 31.5–35.7)
MCV: 91 fL (ref 79–97)
Platelets: 153 10*3/uL (ref 150–450)
RBC: 4.72 x10E6/uL (ref 4.14–5.80)
RDW: 12 % (ref 11.6–15.4)
WBC: 4.1 10*3/uL (ref 3.4–10.8)

## 2019-12-29 LAB — LIPID PANEL
Chol/HDL Ratio: 2 ratio (ref 0.0–5.0)
Cholesterol, Total: 134 mg/dL (ref 100–199)
HDL: 66 mg/dL (ref >39)
LDL Chol Calc (NIH): 53 mg/dL (ref 0–99)
Triglycerides: 76 mg/dL (ref 0–149)
VLDL Cholesterol Cal: 15 mg/dL (ref 5–40)

## 2019-12-29 NOTE — Patient Instructions (Signed)
Medication Instructions:  Your physician recommends that you continue on your current medications as directed. Please refer to the Current Medication list given to you today.  *If you need a refill on your cardiac medications before your next appointment, please call your pharmacy*   Lab Work: Your physician recommends that you return for lab work TODAY:   CBC  Fasting Lipid Panel  If you have labs (blood work) drawn today and your tests are completely normal, you will receive your results only by:  MyChart Message (if you have MyChart) OR  A paper copy in the mail If you have any lab test that is abnormal or we need to change your treatment, we will call you to review the results.  Testing/Procedures: NONE ordered at this time of appointment   Follow-Up: At [facility], you and your health needs are our priority.  As part of our continuing mission to provide you with exceptional heart care, we have created designated Provider Care Teams.  These Care Teams include your primary Cardiologist (physician) and Advanced Practice Providers (APPs -  Physician Assistants and Nurse Practitioners) who all work together to provide you with the care you need, when you need it.  Your next appointment:   1 year(s)  The format for your next appointment:   In Person  Provider:   You may see Minus Breeding, MD or one of the following Advanced Practice Providers on your designated Care Team:    [name], PA-C  Jory Sims, DNP, ANP  Cadence Kathlen Mody, PA-C   Other Instructions

## 2019-12-31 ENCOUNTER — Telehealth: Payer: Self-pay | Admitting: Cardiology

## 2019-12-31 NOTE — Telephone Encounter (Signed)
° ° ° °  Pt's wife called, she requesting to get copy of pt's lab work results send to pt's address. verified address on file

## 2019-12-31 NOTE — Telephone Encounter (Signed)
Spoke to pt's wife who state they are unable to access Eleanor and requesting a copy of recent lab work to be mailed.  Results mailed to pt for review.

## 2020-01-26 ENCOUNTER — Telehealth: Payer: Self-pay | Admitting: Cardiology

## 2020-01-26 NOTE — Telephone Encounter (Signed)
    Patient calling the office for samples of medication:   1.  What medication and dosage are you requesting samples for? XARELTO 20 MG TABS tablet  2.  Are you currently out of this medication? Pt's wife said pt is in donut hole and requesting for samples

## 2020-01-26 NOTE — Telephone Encounter (Signed)
Called patient, advised that we had samples and they were placed up front for patient to come pick up. Left call back number if questions.

## 2020-02-03 ENCOUNTER — Other Ambulatory Visit: Payer: Self-pay | Admitting: Cardiology

## 2020-02-05 DIAGNOSIS — L570 Actinic keratosis: Secondary | ICD-10-CM | POA: Diagnosis not present

## 2020-02-05 DIAGNOSIS — Z85828 Personal history of other malignant neoplasm of skin: Secondary | ICD-10-CM | POA: Diagnosis not present

## 2020-02-05 DIAGNOSIS — D1801 Hemangioma of skin and subcutaneous tissue: Secondary | ICD-10-CM | POA: Diagnosis not present

## 2020-02-05 DIAGNOSIS — D485 Neoplasm of uncertain behavior of skin: Secondary | ICD-10-CM | POA: Diagnosis not present

## 2020-02-05 DIAGNOSIS — L821 Other seborrheic keratosis: Secondary | ICD-10-CM | POA: Diagnosis not present

## 2020-02-05 DIAGNOSIS — D0461 Carcinoma in situ of skin of right upper limb, including shoulder: Secondary | ICD-10-CM | POA: Diagnosis not present

## 2020-02-26 DIAGNOSIS — H524 Presbyopia: Secondary | ICD-10-CM | POA: Diagnosis not present

## 2020-02-26 DIAGNOSIS — H2513 Age-related nuclear cataract, bilateral: Secondary | ICD-10-CM | POA: Diagnosis not present

## 2020-02-26 DIAGNOSIS — H353131 Nonexudative age-related macular degeneration, bilateral, early dry stage: Secondary | ICD-10-CM | POA: Diagnosis not present

## 2020-03-15 ENCOUNTER — Other Ambulatory Visit: Payer: Self-pay | Admitting: Cardiology

## 2020-03-15 NOTE — Telephone Encounter (Signed)
Patient calling the office for samples of medication:   1.  What medication and dosage are you requesting samples for? Xarelto  2.  Are you currently out of this medication? Completely out in the donut hole

## 2020-03-16 NOTE — Telephone Encounter (Signed)
Called and spoke w/pt's wife and stated that I can give 1 week suplly due to onsite supplies and also include a copay card that they can apply for by phone and they voiced understanding

## 2020-03-26 ENCOUNTER — Other Ambulatory Visit: Payer: Self-pay

## 2020-03-26 MED ORDER — RIVAROXABAN 20 MG PO TABS: ORAL_TABLET | ORAL | 1 refills | Status: DC

## 2020-03-29 DIAGNOSIS — I10 Essential (primary) hypertension: Secondary | ICD-10-CM | POA: Diagnosis not present

## 2020-03-29 DIAGNOSIS — Z0000 Encounter for general adult medical examination without abnormal findings: Secondary | ICD-10-CM | POA: Diagnosis not present

## 2020-03-29 DIAGNOSIS — E785 Hyperlipidemia, unspecified: Secondary | ICD-10-CM | POA: Diagnosis not present

## 2020-03-29 DIAGNOSIS — E298 Other testicular dysfunction: Secondary | ICD-10-CM | POA: Diagnosis not present

## 2020-03-29 DIAGNOSIS — Z125 Encounter for screening for malignant neoplasm of prostate: Secondary | ICD-10-CM | POA: Diagnosis not present

## 2020-03-29 DIAGNOSIS — R7301 Impaired fasting glucose: Secondary | ICD-10-CM | POA: Diagnosis not present

## 2020-04-07 DIAGNOSIS — E8881 Metabolic syndrome: Secondary | ICD-10-CM | POA: Diagnosis not present

## 2020-04-07 DIAGNOSIS — D72819 Decreased white blood cell count, unspecified: Secondary | ICD-10-CM | POA: Diagnosis not present

## 2020-04-07 DIAGNOSIS — I480 Paroxysmal atrial fibrillation: Secondary | ICD-10-CM | POA: Diagnosis not present

## 2020-04-07 DIAGNOSIS — Z23 Encounter for immunization: Secondary | ICD-10-CM | POA: Diagnosis not present

## 2020-04-07 DIAGNOSIS — Z86010 Personal history of colonic polyps: Secondary | ICD-10-CM | POA: Diagnosis not present

## 2020-04-07 DIAGNOSIS — R8290 Unspecified abnormal findings in urine: Secondary | ICD-10-CM | POA: Diagnosis not present

## 2020-04-07 DIAGNOSIS — Z0000 Encounter for general adult medical examination without abnormal findings: Secondary | ICD-10-CM | POA: Diagnosis not present

## 2020-04-07 DIAGNOSIS — Z7901 Long term (current) use of anticoagulants: Secondary | ICD-10-CM | POA: Diagnosis not present

## 2020-04-07 DIAGNOSIS — Z1331 Encounter for screening for depression: Secondary | ICD-10-CM | POA: Diagnosis not present

## 2020-04-07 DIAGNOSIS — R82998 Other abnormal findings in urine: Secondary | ICD-10-CM | POA: Diagnosis not present

## 2020-04-07 DIAGNOSIS — I2510 Atherosclerotic heart disease of native coronary artery without angina pectoris: Secondary | ICD-10-CM | POA: Diagnosis not present

## 2020-04-07 DIAGNOSIS — E298 Other testicular dysfunction: Secondary | ICD-10-CM | POA: Diagnosis not present

## 2020-04-07 DIAGNOSIS — Z1339 Encounter for screening examination for other mental health and behavioral disorders: Secondary | ICD-10-CM | POA: Diagnosis not present

## 2020-04-07 DIAGNOSIS — R7301 Impaired fasting glucose: Secondary | ICD-10-CM | POA: Diagnosis not present

## 2020-04-07 DIAGNOSIS — D696 Thrombocytopenia, unspecified: Secondary | ICD-10-CM | POA: Diagnosis not present

## 2020-04-07 DIAGNOSIS — I10 Essential (primary) hypertension: Secondary | ICD-10-CM | POA: Diagnosis not present

## 2020-04-07 DIAGNOSIS — E785 Hyperlipidemia, unspecified: Secondary | ICD-10-CM | POA: Diagnosis not present

## 2020-04-07 DIAGNOSIS — D6869 Other thrombophilia: Secondary | ICD-10-CM | POA: Diagnosis not present

## 2020-04-08 DIAGNOSIS — H3562 Retinal hemorrhage, left eye: Secondary | ICD-10-CM | POA: Diagnosis not present

## 2020-04-30 DIAGNOSIS — Z1212 Encounter for screening for malignant neoplasm of rectum: Secondary | ICD-10-CM | POA: Diagnosis not present

## 2020-06-28 ENCOUNTER — Other Ambulatory Visit: Payer: Self-pay | Admitting: Cardiology

## 2020-06-30 DIAGNOSIS — D485 Neoplasm of uncertain behavior of skin: Secondary | ICD-10-CM | POA: Diagnosis not present

## 2020-06-30 DIAGNOSIS — Z85828 Personal history of other malignant neoplasm of skin: Secondary | ICD-10-CM | POA: Diagnosis not present

## 2020-06-30 DIAGNOSIS — L820 Inflamed seborrheic keratosis: Secondary | ICD-10-CM | POA: Diagnosis not present

## 2020-07-06 ENCOUNTER — Encounter: Payer: Self-pay | Admitting: Gastroenterology

## 2020-07-13 ENCOUNTER — Encounter: Payer: Self-pay | Admitting: Gastroenterology

## 2020-07-26 ENCOUNTER — Other Ambulatory Visit: Payer: Self-pay | Admitting: Cardiology

## 2020-07-26 DIAGNOSIS — I480 Paroxysmal atrial fibrillation: Secondary | ICD-10-CM

## 2020-07-27 NOTE — Telephone Encounter (Signed)
53m, 83kg, Creatinine, Serum 1.000 mg/ 03/25/2019  (OUTDATED), ccr 78, lovw/hochrein 12/29/19. Pt requesting refill of xarelto 20mg  but labs overdue. Will send 1 month supply with instructions to complete labs

## 2020-08-10 DIAGNOSIS — I480 Paroxysmal atrial fibrillation: Secondary | ICD-10-CM | POA: Diagnosis not present

## 2020-08-10 LAB — COMPREHENSIVE METABOLIC PANEL
ALT: 21 IU/L (ref 0–44)
AST: 27 IU/L (ref 0–40)
Albumin/Globulin Ratio: 1.7 (ref 1.2–2.2)
Albumin: 4.5 g/dL (ref 3.7–4.7)
Alkaline Phosphatase: 50 IU/L (ref 44–121)
BUN/Creatinine Ratio: 21 (ref 10–24)
BUN: 20 mg/dL (ref 8–27)
Bilirubin Total: 0.6 mg/dL (ref 0.0–1.2)
CO2: 21 mmol/L (ref 20–29)
Calcium: 9.2 mg/dL (ref 8.6–10.2)
Chloride: 97 mmol/L (ref 96–106)
Creatinine, Ser: 0.97 mg/dL (ref 0.76–1.27)
Globulin, Total: 2.7 g/dL (ref 1.5–4.5)
Glucose: 116 mg/dL — ABNORMAL HIGH (ref 65–99)
Potassium: 4.5 mmol/L (ref 3.5–5.2)
Sodium: 137 mmol/L (ref 134–144)
Total Protein: 7.2 g/dL (ref 6.0–8.5)
eGFR: 83 mL/min/{1.73_m2} (ref >59)

## 2020-08-23 ENCOUNTER — Telehealth: Payer: Self-pay | Admitting: Cardiology

## 2020-08-23 ENCOUNTER — Other Ambulatory Visit: Payer: Self-pay | Admitting: Cardiology

## 2020-08-23 NOTE — Telephone Encounter (Signed)
Prescription refill request for Xarelto received.  Indication: Atrial fibrillation Last office visit:8/21 hochrein Weight:83 kg Age:75 Scr:0.97 3/22 CrCl:80.81 ml/min  Prescription refilled

## 2020-08-23 NOTE — Telephone Encounter (Signed)
Patient's wife is requesting to go over his results from 08/10/20 CMET. She states she received the letter regarding the lab results, however, she assumed the patient was having his cholesterol checked and there is no recommendation regarding his cholesterol. She assumed the patient was having a lipid panel. She requested a call back from Dr. Rosezella [place] nurse with clarification.

## 2020-08-23 NOTE — Telephone Encounter (Signed)
Spoke to wife. Okay per DPR.  Mrs Mckay states the patient was under the impression  that he was getting labs to check how well his cholesterol level is doing- Rosuvastatin.  RN informed Mrs Rohr - the lab CMP - was obtained to get a base line for serum creatinine since patient is taking Xarelto.  The patient last lipid was taking 12/2019. The results were good - would probably need to rechecked  Annually.  Mrs Loeber verbalized understanding.  Mrs Merriott also states the patient has an upcoming appointment for possible colonoscopy. She states he has an appointment on 09/06/20 with GI.  Wife would like to know if patient can get clearance now for upcoming procedure. RN explain to Mrs Delauder the protocol for pre-op clearance for colonoscopy.- Awaiting for written  Pre -op clearance from GI.  Mrs Kandel verbalized understanding.

## 2020-08-24 ENCOUNTER — Ambulatory Visit: Payer: PPO | Admitting: Gastroenterology

## 2020-09-06 ENCOUNTER — Other Ambulatory Visit: Payer: Self-pay

## 2020-09-06 ENCOUNTER — Encounter: Payer: Self-pay | Admitting: Physician Assistant

## 2020-09-06 ENCOUNTER — Telehealth: Payer: Self-pay

## 2020-09-06 ENCOUNTER — Ambulatory Visit: Payer: PPO | Admitting: Physician Assistant

## 2020-09-06 VITALS — BP 146/76 | HR 84 | Ht 69.0 in | Wt 193.0 lb

## 2020-09-06 DIAGNOSIS — Z7901 Long term (current) use of anticoagulants: Secondary | ICD-10-CM | POA: Diagnosis not present

## 2020-09-06 DIAGNOSIS — Z86010 Personal history of colonic polyps: Secondary | ICD-10-CM | POA: Diagnosis not present

## 2020-09-06 MED ORDER — NA SULFATE-K SULFATE-MG SULF 17.5-3.13-1.6 GM/177ML PO SOLN: 1.0000 | Freq: Once | ORAL | 0 refills | Status: AC

## 2020-09-06 NOTE — Progress Notes (Signed)
Chief Complaint: History of adenomatous polyps  HPI:    Matthew Briggs is a 75 year old male, known to Dr. Fuller Plan, with a past medical history as listed below including A. fib on Xarelto (12/04/2017 echo with LVEF 60-65%), who presents to clinic today to discuss his history of adenomatous polyps.    06/15/2015 colonoscopy with 2 sessile polyps in the transverse and ascending colon, moderate diverticulosis in the sigmoid and descending colon and otherwise normal.  Pathology showed adenomatous polyps.  Repeat recommended in 5 years.    Today, the patient presents to clinic and tells me that he is doing well.  The Xarelto is new for him since being seen in our clinic last, but was started back in 2019.  He has never had to hold it for any other procedures.  Denies any GI complaints or concerns.    Denies fever, chills, weight loss, heartburn, reflux, abdominal pain or change in bowel habits.  Past Medical History:  Diagnosis Date  . Abnormal computed tomography angiography (CTA) 10/05/2017  . Abnormal MRI   . Arthritis    "hands" (10/15/2017)  . Atrial fibrillation ([place])   . Coronary artery disease involving native heart 10/15/2017  . Essential hypertension 02/02/2015  . Hyperlipidemia with target LDL less than 70 02/02/2015  . Hypogonadism, male   . Laceration of chest wall 08/08/2017  . PVC's (premature ventricular contractions) 01/27/2017    Past Surgical History:  Procedure Laterality Date  . CARPAL TUNNEL RELEASE Left 04/2017  . COLONOSCOPY    . CORONARY STENT INTERVENTION N/A 10/15/2017   Procedure: CORONARY STENT INTERVENTION;  Surgeon: Leonie Man, MD;  Location: [facility];  Service: Cardiovascular;  Laterality: N/A;  . FRACTURE SURGERY    . INGUINAL HERNIA REPAIR Right ~ 1970  . IRRIGATION AND DEBRIDEMENT ABSCESS  08/08/2017   Procedure: IRRIGATION AND DEBRIDEMENT OF CHEST;  Surgeon: Judeth Horn, MD;  Location: [place];  Service: General;;  . LEFT HEART CATH AND CORONARY ANGIOGRAPHY  N/A 10/15/2017   Procedure: LEFT HEART CATH AND CORONARY ANGIOGRAPHY;  Surgeon: Leonie Man, MD;  Location: [facility];  Service: Cardiovascular;  Laterality: N/A;  . MINOR IRRIGATION AND DEBRIDEMENT OF WOUND Left 08/08/2017   Procedure: CLOSED REDUCTION REPAIR OF PIP JOINT/LACERATION LEFT MIDDLE FINGER;  Surgeon: Milly Jakob, MD;  Location: [facility];  Service: Orthopedics;  Laterality: Left;  . OPEN REDUCTION INTERNAL FIXATION (ORIF) DISTAL PHALANX Left 08/13/2017   Procedure: OPEN TREATMENT OF LEFT LONG FINGER OPEN JOINT DISLOCATION OF PROXIMAL PHALANYX WITH EXTENSOR TENDON REPAIR;  Surgeon: Milly Jakob, MD;  Location: [place];  Service: Orthopedics;  Laterality: Left;  . REPAIR EXTENSOR TENDON Left 08/13/2017   Procedure: REPAIR EXTENSOR TENDON;  Surgeon: Milly Jakob, MD;  Location: [place];  Service: Orthopedics;  Laterality: Left;    Current Outpatient Medications  Medication Sig Dispense Refill  . acetaminophen (TYLENOL) 325 MG tablet Take 2 tablets (650 mg total) by mouth every 6 (six) hours.    . Calcium-Phosphorus-Vitamin D (CITRACAL +D3 PO) Take 1 tablet by mouth daily with supper.    . cholecalciferol (VITAMIN D) 1000 UNITS tablet Take 1,000 Units by mouth daily with supper.     . Coenzyme Q10 (CO Q 10) 100 MG CAPS Take 100 mg by mouth daily with supper. Qunol CoQ10    . diclofenac sodium (VOLTAREN) 1 % GEL Apply 2-4 g topically 4 (four) times daily as needed (for arthritis pain).    Marland Kitchen  Krill Oil 500 MG CAPS Take 500 mg by mouth daily with supper.    . Multiple Vitamin (MULTIVITAMIN WITH MINERALS) TABS tablet Take 1 tablet by mouth daily with supper.     . Potassium 99 MG TABS Take 99 mg by mouth daily with supper.    . quiNINE (QUALAQUIN) 324 MG capsule Take 648 mg by mouth daily as needed (for cramps.).     Marland Kitchen rosuvastatin (CRESTOR) 20 MG tablet TAKE 1 TABLET(20 MG) BY MOUTH AT BEDTIME 90 tablet 3  . traMADol (ULTRAM) 50 MG tablet  Take 1 tablet (50 mg total) by mouth every 6 (six) hours as needed (mild pain). 20 tablet 0  . valsartan-hydrochlorothiazide (DIOVAN-HCT) 160-12.5 MG tablet TAKE 1 TABLET BY MOUTH DAILY 90 tablet 3  . XARELTO 20 MG TABS tablet TAKE 1 TABLET BY MOUTH EVERY DAY WITH SUPPER 60 tablet 3   No current facility-administered medications for this visit.    Allergies as of 09/06/2020  . (No Known Allergies)    Family History  Problem Relation Age of Onset  . CAD Father   . Heart attack Maternal Grandfather 42  . Heart attack Paternal Grandfather 20  . Colon cancer Neg Hx   . Esophageal cancer Neg Hx   . Rectal cancer Neg Hx   . Stomach cancer Neg Hx     Social History   Socioeconomic History  . Marital status: Married    Spouse name: Not on file  . Number of children: 2  . Years of education: Not on file  . Highest education level: Not on file  Occupational History  . Occupation: Publishing copy  Tobacco Use  . Smoking status: Never Smoker  . Smokeless tobacco: Never Used  Vaping Use  . Vaping Use: Never used  Substance and Sexual Activity  . Alcohol use: Yes    Alcohol/week: 7.0 standard drinks    Types: 7 Glasses of wine per week  . Drug use: No  . Sexual activity: Yes  Other Topics Concern  . 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  Married, one child and one stepchild.     Social Determinants of Health   Financial Resource Strain: Not on file  Food Insecurity: Not on file  Transportation Needs: Not on file  Physical Activity: Not on file  Stress: Not on file  Social Connections: Not on file  Intimate Partner Violence: Not on file    Review of Systems:    Constitutional: No weight loss, fever or chills Skin: No rash  Cardiovascular: No chest pain Respiratory: No SOB  Gastrointestinal: See HPI and otherwise negative Genitourinary: No dysuria  Neurological:  No headache, dizziness or syncope Musculoskeletal: No new muscle or joint pain Hematologic: No bleeding  Psychiatric: No history of depression or anxiety    Physical Exam:  Vital signs: BP (!) 146/76   Pulse 84   Ht 5\' 9"  (1.753 m)   Wt 193 lb (87.5 kg)   BMI 28.50 kg/m   Constitutional:   Pleasant Caucasian male appears to be in NAD, Well developed, Well nourished, alert and cooperative Head:  Normocephalic and atraumatic. Eyes:   PEERL, EOMI. No icterus. Conjunctiva pink. Ears:  Normal auditory acuity. Neck:  Supple Throat: Oral cavity and pharynx without inflammation, swelling or lesion.  Respiratory: Respirations even and unlabored. Lungs clear to auscultation bilaterally.   No wheezes,  crackles, or rhonchi.  Cardiovascular: Normal S1, S2. No MRG. Regular rate and rhythm. No peripheral edema, cyanosis or pallor.  Gastrointestinal:  Soft, nondistended, nontender. No rebound or guarding. Normal bowel sounds. No appreciable masses or hepatomegaly. Rectal:  Not performed.  Msk:  Symmetrical without gross deformities. Without edema, no deformity or joint abnormality.  Neurologic:  Alert and  oriented x4;  grossly normal neurologically.  Skin:   Dry and intact without significant lesions or rashes. Psychiatric: Demonstrates good judgement and reason without abnormal affect or behaviors.  No recent labs or imaging.  Assessment: 1.  History of adenomatous polyps: Last colonoscopy January 2017 with repeat recommended in 5 years 2.  Chronic anticoagulation for A. fib: With Xarelto  Plan: 1.  Scheduled patient for surveillance colonoscopy in the [place] with Dr. Fuller Plan.  Provided the patient with a detailed list of risks for the procedure and he agrees to proceed. 2.  Patient was advised to hold his Xarelto for 2 days prior to time of procedure.  We will communicate with his prescribing physician to ensure this is acceptable for him. 3.  Patient to follow in clinic per recommendations from  Dr. Fuller Plan after time of procedure.  Ellouise Newer, PA-C [facility] 09/06/2020, 9:21 AM  Cc: Ginger Organ., MD

## 2020-09-06 NOTE — Progress Notes (Signed)
Reviewed and agree with management plan.  Daneka Lantigua T. Khamiyah Grefe, MD FACG [phone]  

## 2020-09-06 NOTE — Telephone Encounter (Signed)
Please comment on Xarelto

## 2020-09-06 NOTE — Patient Instructions (Signed)
If you are age 73 or older, your body mass index should be between 23-30. Your Body mass index is 28.5 kg/m. If this is out of the aforementioned range listed, please consider follow up with your Primary Care Provider.  If you are age 62 or younger, your body mass index should be between 19-25. Your Body mass index is 28.5 kg/m. If this is out of the aformentioned range listed, please consider follow up with your Primary Care Provider.   You have been scheduled for a colonoscopy. Please follow written instructions given to you at your visit today.  Please pick up your prep supplies at the pharmacy within the next 1-3 days. If you use inhalers (even only as needed), please bring them with you on the day of your procedure.  It was a pleasure to see you today!  Ellouise Newer, PA-C

## 2020-09-06 NOTE — Telephone Encounter (Signed)
[facility] Pre-operative Risk Assessment     Request for surgical clearance:     Endoscopy Procedure  What type of surgery is being performed?     Colonoscopy  When is this surgery scheduled?     12/07/20  What type of clearance is required ?   Pharmacy  Are there any medications that need to be held prior to surgery and how long? Xarelto 2 days   Practice name and name of physician performing surgery?      [facility]  What is your office phone and fax number?      Phone- [phone]  [phone]  Anesthesia type (None, local, MAC, general) ?       MAC

## 2020-09-07 NOTE — Telephone Encounter (Signed)
   Patient Name: Matthew Briggs  DOB: April 02, 1946  MRN: [id number]   Primary Cardiologist: Minus Breeding, MD  Chart reviewed as part of pre-operative protocol coverage. Patient was contacted 09/07/2020 in reference to pre-operative risk assessment for pending surgery as outlined below.  Matthew Briggs was last seen on 12/28/20 by Dr. Percival Spanish and was doing well form a cardiac perspective.  Since that day, Matthew Briggs denies any changes, no anginal symptoms, overall stable from a cardiac standpoint. Per pharmacy, okay to old Xarelto 2 days prior to the procedure.   Therefore, based on ACC/AHA guidelines, the patient would be at acceptable risk for the planned procedure without further cardiovascular testing.   The patient was advised that if he develops new symptoms prior to surgery to contact our office to arrange for a follow-up visit, and he verbalized understanding.  I will route this recommendation to the requesting party via Epic fax function and remove from pre-op pool. Please call with questions.  Matthew Fread Ninfa Meeker, PA-C 09/07/2020, 9:23 AM

## 2020-09-07 NOTE — Telephone Encounter (Signed)
Patient with diagnosis of afib on Xarelto for anticoagulation.    Procedure: colonoacopy Date of procedure: 12/07/20  CHA2DS2-VASc Score = 3  This indicates a 3.2% annual risk of stroke. The patient's score is based upon: CHF History: No HTN History: Yes Diabetes History: No Stroke History: No Vascular Disease History: Yes Age Score: 1 Gender Score: 0     CrCl 68.8 ml/min Platelet count 153  Per office protocol, patient can hold Xarelto for 2 days prior to procedure.

## 2020-09-23 ENCOUNTER — Ambulatory Visit: Payer: PPO | Admitting: Gastroenterology

## 2020-09-24 DIAGNOSIS — E785 Hyperlipidemia, unspecified: Secondary | ICD-10-CM | POA: Diagnosis not present

## 2020-10-19 DIAGNOSIS — I2510 Atherosclerotic heart disease of native coronary artery without angina pectoris: Secondary | ICD-10-CM | POA: Diagnosis not present

## 2020-10-19 DIAGNOSIS — J309 Allergic rhinitis, unspecified: Secondary | ICD-10-CM | POA: Diagnosis not present

## 2020-10-19 DIAGNOSIS — Z1152 Encounter for screening for COVID-19: Secondary | ICD-10-CM | POA: Diagnosis not present

## 2020-10-19 DIAGNOSIS — I480 Paroxysmal atrial fibrillation: Secondary | ICD-10-CM | POA: Diagnosis not present

## 2020-10-19 DIAGNOSIS — R059 Cough, unspecified: Secondary | ICD-10-CM | POA: Diagnosis not present

## 2020-10-19 DIAGNOSIS — U071 COVID-19: Secondary | ICD-10-CM | POA: Diagnosis not present

## 2020-10-19 DIAGNOSIS — Z7901 Long term (current) use of anticoagulants: Secondary | ICD-10-CM | POA: Diagnosis not present

## 2020-12-03 DIAGNOSIS — R051 Acute cough: Secondary | ICD-10-CM | POA: Diagnosis not present

## 2020-12-03 DIAGNOSIS — Z7901 Long term (current) use of anticoagulants: Secondary | ICD-10-CM | POA: Diagnosis not present

## 2020-12-03 DIAGNOSIS — U071 COVID-19: Secondary | ICD-10-CM | POA: Diagnosis not present

## 2020-12-03 DIAGNOSIS — J069 Acute upper respiratory infection, unspecified: Secondary | ICD-10-CM | POA: Diagnosis not present

## 2020-12-03 DIAGNOSIS — I480 Paroxysmal atrial fibrillation: Secondary | ICD-10-CM | POA: Diagnosis not present

## 2020-12-07 ENCOUNTER — Ambulatory Visit (AMBULATORY_SURGERY_CENTER): Payer: PPO | Admitting: Gastroenterology

## 2020-12-07 ENCOUNTER — Other Ambulatory Visit: Payer: Self-pay

## 2020-12-07 ENCOUNTER — Encounter: Payer: Self-pay | Admitting: Gastroenterology

## 2020-12-07 VITALS — BP 113/61 | HR 46 | Temp 98.4°F | Resp 11 | Ht 69.0 in | Wt 193.0 lb

## 2020-12-07 DIAGNOSIS — Z86010 Personal history of colonic polyps: Secondary | ICD-10-CM | POA: Diagnosis not present

## 2020-12-07 DIAGNOSIS — D125 Benign neoplasm of sigmoid colon: Secondary | ICD-10-CM | POA: Diagnosis not present

## 2020-12-07 DIAGNOSIS — D120 Benign neoplasm of cecum: Secondary | ICD-10-CM | POA: Diagnosis not present

## 2020-12-07 MED ORDER — SODIUM CHLORIDE 0.9 % IV SOLN
500.0000 mL | Freq: Once | INTRAVENOUS | Status: AC
Start: 2020-12-07 — End: ?

## 2020-12-07 NOTE — Op Note (Signed)
[place] Patient Name: Matthew Briggs Procedure Date: 12/07/2020 8:59 AM MRN: [id number] Endoscopist: Ladene Artist , MD Age: 75 Referring MD:  Date of Birth: [birth year] Gender: Male Account #: [id number] Procedure:                Colonoscopy Indications:              Surveillance: Personal history of adenomatous                            polyps on last colonoscopy 5 years ago Medicines:                Monitored Anesthesia Care Procedure:                Pre-Anesthesia Assessment:                           - Prior to the procedure, a History and Physical                            was performed, and patient medications and                            allergies were reviewed. The patient's tolerance of                            previous anesthesia was also reviewed. The risks                            and benefits of the procedure and the sedation                            options and risks were discussed with the patient.                            All questions were answered, and informed consent                            was obtained. Prior Anticoagulants: The patient has                            taken Xarelto (rivaroxaban), last dose was 2 days                            prior to procedure. ASA Grade Assessment: II - A                            patient with mild systemic disease. After reviewing                            the risks and benefits, the patient was deemed in                            satisfactory condition to undergo the procedure.  After obtaining informed consent, the colonoscope                            was passed under direct vision. Throughout the                            procedure, the patient's blood pressure, pulse, and                            oxygen saturations were monitored continuously. The                            CF-HQ190L was introduced through the anus and                            advanced to the  the cecum, identified by                            appendiceal orifice and ileocecal valve. The                            ileocecal valve, appendiceal orifice, and rectum                            were photographed. The quality of the bowel                            preparation was good. The colonoscopy was performed                            without difficulty. The patient tolerated the                            procedure well. Scope In: 9:13:53 AM Scope Out: 9:30:45 AM Scope Withdrawal Time: 0 hours 12 minutes 36 seconds  Total Procedure Duration: 0 hours 16 minutes 52 seconds  Findings:                 The perianal and digital rectal examinations were                            normal.                           Two sessile polyps were found in the cecum. The                            polyps were 3 mm in size. These polyps were removed                            with a cold biopsy forceps. Resection and retrieval                            were complete.  A 6 mm polyp was found in the sigmoid colon. The                            polyp was sessile. The polyp was removed with a                            cold snare. Resection and retrieval were complete.                           Scattered medium-mouthed diverticula were found in                            the right colon. There was no evidence of                            diverticular bleeding.                           Many medium-mouthed diverticula were found in the                            left colon. There was evidence of diverticular                            spasm.                           Internal hemorrhoids were found during                            retroflexion. The hemorrhoids were small and Grade                            I (internal hemorrhoids that do not prolapse).                           The exam was otherwise without abnormality on                            direct and  retroflexion views. Complications:            No immediate complications. Estimated blood loss:                            None. Estimated Blood Loss:     Estimated blood loss: none. Impression:               - Two 3 mm polyps in the cecum, removed with a cold                            biopsy forceps. Resected and retrieved.                           - One 6 mm polyp in the sigmoid colon, removed with  a cold snare. Resected and retrieved.                           - Mild diverticulosis in the right colon.                           - Moderate diverticulosis in the left colon.                           - Internal hemorrhoids.                           - The examination was otherwise normal on direct                            and retroflexion views. Recommendation:           - Repeat colonoscopy after studies are complete for                            surveillance based on pathology results.                           - Resume Xarelto (rivaroxaban) in 3 days at prior                            dose. Refer to managing physician for further                            adjustment of therapy.                           - Patient has a contact number available for                            emergencies. The signs and symptoms of potential                            delayed complications were discussed with the                            patient. Return to normal activities tomorrow.                            Written discharge instructions were provided to the                            patient.                           - High fiber diet.                           - Continue present medications.                           - Await pathology  results.                           - No aspirin, ibuprofen, naproxen, or other                            non-steroidal anti-inflammatory drugs for 2 weeks                            after polyp removal. Ladene Artist,  MD 12/07/2020 9:36:08 AM This report has been signed electronically.

## 2020-12-07 NOTE — Progress Notes (Signed)
pt tolerated well. VSS. awake and to recovery. Report given to RN.  

## 2020-12-07 NOTE — Patient Instructions (Signed)
Resume Xarelto in 3 days at prior dose (12/10/20)  NO ASPIRIN,IBUPROFEN,NAROXEN,OR OTHER NON-STEROIDAL ANTI INFLAMMATORY DRUGS FOR 2 WEEKS AFTER POLYP REMOVAL   Handouts on polyps,diverticulosis,hemorrhoids,& high fiber diet given to you today   YOU HAD AN ENDOSCOPIC PROCEDURE TODAY AT [place]:   Refer to the procedure report that was given to you for any specific questions about what was found during the examination.  If the procedure report does not answer your questions, please call your gastroenterologist to clarify.  If you requested that your care partner not be given the details of your procedure findings, then the procedure report has been included in a sealed envelope for you to review at your convenience later.  YOU SHOULD EXPECT: Some feelings of bloating in the abdomen. Passage of more gas than usual.  Walking can help get rid of the air that was put into your GI tract during the procedure and reduce the bloating. If you had a lower endoscopy (such as a colonoscopy or flexible sigmoidoscopy) you may notice spotting of blood in your stool or on the toilet paper. If you underwent a bowel prep for your procedure, you may not have a normal bowel movement for a few days.  Please Note:  You might notice some irritation and congestion in your nose or some drainage.  This is from the oxygen used during your procedure.  There is no need for concern and it should clear up in a day or so.  SYMPTOMS TO REPORT IMMEDIATELY:  Following lower endoscopy (colonoscopy or flexible sigmoidoscopy):  Excessive amounts of blood in the stool  Significant tenderness or worsening of abdominal pains  Swelling of the abdomen that is new, acute  Fever of 100F or higher   For urgent or emergent issues, a gastroenterologist can be reached at any hour by calling [phone]. Do not use MyChart messaging for urgent concerns.    DIET:  We do recommend a small meal at first, but then  you may proceed to your regular diet.  Drink plenty of fluids but you should avoid alcoholic beverages for 24 hours.  ACTIVITY:  You should plan to take it easy for the rest of today and you should NOT DRIVE or use heavy machinery until tomorrow (because of the sedation medicines used during the test).    FOLLOW UP: Our staff will call the number listed on your records 48-72 hours following your procedure to check on you and address any questions or concerns that you may have regarding the information given to you following your procedure. If we do not reach you, we will leave a message.  We will attempt to reach you two times.  During this call, we will ask if you have developed any symptoms of COVID 19. If you develop any symptoms (ie: fever, flu-like symptoms, shortness of breath, cough etc.) before then, please call [phone].  If you test positive for Covid 19 in the 2 weeks post procedure, please call and report this information to [place].    If any biopsies were taken you will be contacted by phone or by letter within the next 1-3 weeks.  Please call us at [phone] if you have not heard about the biopsies in 3 weeks.    SIGNATURES/CONFIDENTIALITY: You and/or your care partner have signed paperwork which will be entered into your electronic medical record.  These signatures attest to the fact that that the information above on your After Visit Summary has been  reviewed and is understood.  Full responsibility of the confidentiality of this discharge information lies with you and/or your care-partner.

## 2020-12-07 NOTE — Progress Notes (Signed)
Called to room to assist during endoscopic procedure.  Patient ID and intended procedure confirmed with present staff. Received instructions for my participation in the procedure from the performing physician.  

## 2020-12-07 NOTE — Progress Notes (Signed)
Cw vitals and SB IV.

## 2020-12-16 ENCOUNTER — Encounter: Payer: Self-pay | Admitting: Gastroenterology

## 2021-01-19 DIAGNOSIS — H903 Sensorineural hearing loss, bilateral: Secondary | ICD-10-CM | POA: Diagnosis not present
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
## 2021-01-20 ENCOUNTER — Encounter: Payer: Self-pay | Admitting: Cardiology

## 2021-01-20 ENCOUNTER — Other Ambulatory Visit: Payer: Self-pay

## 2021-01-20 ENCOUNTER — Ambulatory Visit (INDEPENDENT_AMBULATORY_CARE_PROVIDER_SITE_OTHER): Payer: PPO | Admitting: Cardiology

## 2021-01-20 VITALS — BP 130/72 | HR 53 | Ht 69.0 in | Wt 186.6 lb

## 2021-01-20 DIAGNOSIS — I10 Essential (primary) hypertension: Secondary | ICD-10-CM | POA: Diagnosis not present

## 2021-01-20 DIAGNOSIS — E785 Hyperlipidemia, unspecified: Secondary | ICD-10-CM

## 2021-01-20 DIAGNOSIS — I2510 Atherosclerotic heart disease of native coronary artery without angina pectoris: Secondary | ICD-10-CM

## 2021-01-20 DIAGNOSIS — I480 Paroxysmal atrial fibrillation: Secondary | ICD-10-CM

## 2021-01-20 DIAGNOSIS — I493 Ventricular premature depolarization: Secondary | ICD-10-CM

## 2021-01-20 NOTE — Patient Instructions (Signed)
Medication Instructions:  No Changes In Medications at this time.  *If you need a refill on your cardiac medications before your next appointment, please call your pharmacy*  Follow-Up: At [facility], you and your health needs are our priority.  As part of our continuing mission to provide you with exceptional heart care, we have created designated Provider Care Teams.  These Care Teams include your primary Cardiologist (physician) and Advanced Practice Providers (APPs -  Physician Assistants and Nurse Practitioners) who all work together to provide you with the care you need, when you need it.  Your next appointment:   1 year(s)  The format for your next appointment:   In Person  Provider:   Minus Breeding, MD

## 2021-02-02 DIAGNOSIS — L820 Inflamed seborrheic keratosis: Secondary | ICD-10-CM | POA: Diagnosis not present

## 2021-02-02 DIAGNOSIS — C44329 Squamous cell carcinoma of skin of other parts of face: Secondary | ICD-10-CM | POA: Diagnosis not present

## 2021-02-02 DIAGNOSIS — L821 Other seborrheic keratosis: Secondary | ICD-10-CM | POA: Diagnosis not present

## 2021-02-02 DIAGNOSIS — L812 Freckles: Secondary | ICD-10-CM | POA: Diagnosis not present

## 2021-02-02 DIAGNOSIS — Z85828 Personal history of other malignant neoplasm of skin: Secondary | ICD-10-CM | POA: Diagnosis not present

## 2021-02-02 DIAGNOSIS — L570 Actinic keratosis: Secondary | ICD-10-CM | POA: Diagnosis not present

## 2021-02-02 DIAGNOSIS — D485 Neoplasm of uncertain behavior of skin: Secondary | ICD-10-CM | POA: Diagnosis not present

## 2021-02-09 ENCOUNTER — Telehealth: Payer: Self-pay | Admitting: Cardiology

## 2021-02-09 NOTE — Telephone Encounter (Signed)
Pt qualifies for samples of xarelto '20mg'$ , however; please make sure to include the patient assistance form with it. Hopefully this can resolve this problem and won't have to sample this pt in the future. Forms can be found and printed from: [email or web address] Prescription sample request for Xarelto received.  Indication:afib Last office visit:hochrein 01/20/21 Weight:84.6kg Age:39mScr:0.97 08/10/20 CrCl:81.2

## 2021-02-09 NOTE — Telephone Encounter (Signed)
Patient calling the office for samples of medication:   1.  What medication and dosage are you requesting samples for? XARELTO 20 MG TABS tablet   2.  Are you currently out of this medication? No. Patient has about a week left

## 2021-03-02 DIAGNOSIS — Z85828 Personal history of other malignant neoplasm of skin: Secondary | ICD-10-CM | POA: Diagnosis not present

## 2021-03-02 DIAGNOSIS — C44329 Squamous cell carcinoma of skin of other parts of face: Secondary | ICD-10-CM | POA: Diagnosis not present

## 2021-03-09 ENCOUNTER — Other Ambulatory Visit: Payer: Self-pay | Admitting: Cardiology

## 2021-04-25 DIAGNOSIS — E298 Other testicular dysfunction: Secondary | ICD-10-CM | POA: Diagnosis not present

## 2021-04-26 DIAGNOSIS — Z125 Encounter for screening for malignant neoplasm of prostate: Secondary | ICD-10-CM | POA: Diagnosis not present

## 2021-04-26 DIAGNOSIS — E785 Hyperlipidemia, unspecified: Secondary | ICD-10-CM | POA: Diagnosis not present

## 2021-04-26 DIAGNOSIS — R7301 Impaired fasting glucose: Secondary | ICD-10-CM | POA: Diagnosis not present

## 2021-04-26 DIAGNOSIS — I10 Essential (primary) hypertension: Secondary | ICD-10-CM | POA: Diagnosis not present

## 2021-05-03 ENCOUNTER — Other Ambulatory Visit: Payer: Self-pay | Admitting: Cardiology

## 2021-05-03 NOTE — Telephone Encounter (Signed)
Prescription refill request for Xarelto received.   Indication: afib  Last office visit: Hochrein, 01/20/2021 Weight:84.6 kg Age: 75 yo  Scr: 0.97, 08/10/2020 CrCl: 81 ml/min   Refill sent.

## 2021-05-29 DEATH — deceased
